# Patient Record
Sex: Male | Born: 1940 | Race: White | Hispanic: No | Marital: Married | Smoking: Current every day smoker
Health system: Southern US, Community
[De-identification: ages and names within clinical notes are randomized; demographics above are authoritative.]

## PROBLEM LIST (undated history)

## (undated) DIAGNOSIS — I7100 Dissection of unspecified site of aorta: Secondary | ICD-10-CM

## (undated) DIAGNOSIS — I716 Thoracoabdominal aortic aneurysm, without rupture: Principal | ICD-10-CM

## (undated) DIAGNOSIS — I10 Essential (primary) hypertension: Secondary | ICD-10-CM

## (undated) DIAGNOSIS — R634 Abnormal weight loss: Secondary | ICD-10-CM

## (undated) DIAGNOSIS — I701 Atherosclerosis of renal artery: Secondary | ICD-10-CM

## (undated) DIAGNOSIS — R0602 Shortness of breath: Secondary | ICD-10-CM

## (undated) DIAGNOSIS — N179 Acute kidney failure, unspecified: Secondary | ICD-10-CM

## (undated) DIAGNOSIS — Z86010 Personal history of colonic polyps: Secondary | ICD-10-CM

## (undated) DIAGNOSIS — N183 Chronic kidney disease, stage 3 (moderate): Secondary | ICD-10-CM

## (undated) HISTORY — DX: Personal history of colonic polyps: Z86.010

## (undated) HISTORY — DX: Shortness of breath: R06.02

## (undated) HISTORY — DX: Dissection of unspecified site of aorta: I71.00

## (undated) HISTORY — DX: Thoracoabdominal aortic aneurysm, without rupture: I71.6

## (undated) HISTORY — PX: TONSILLECTOMY: SUR1361

## (undated) HISTORY — DX: Acute kidney failure, unspecified: N17.9

## (undated) HISTORY — DX: Atherosclerosis of renal artery: I70.1

## (undated) HISTORY — PX: TOTAL KNEE ARTHROPLASTY: SHX125

## (undated) HISTORY — DX: Abnormal weight loss: R63.4

## (undated) HISTORY — PX: OTHER SURGICAL HISTORY: SHX169

## (undated) HISTORY — DX: Essential (primary) hypertension: I10

## (undated) HISTORY — DX: Chronic kidney disease, stage 3 (moderate): N18.3

---

## 2011-08-05 DIAGNOSIS — I701 Atherosclerosis of renal artery: Secondary | ICD-10-CM | POA: Insufficient documentation

## 2011-08-05 DIAGNOSIS — I7100 Dissection of unspecified site of aorta: Secondary | ICD-10-CM

## 2011-08-05 HISTORY — DX: Dissection of unspecified site of aorta: I71.00

## 2011-08-05 HISTORY — DX: Atherosclerosis of renal artery: I70.1

## 2012-01-15 DIAGNOSIS — R634 Abnormal weight loss: Secondary | ICD-10-CM

## 2012-01-15 HISTORY — DX: Abnormal weight loss: R63.4

## 2012-05-06 DIAGNOSIS — Z86010 Personal history of colonic polyps: Secondary | ICD-10-CM

## 2012-05-06 HISTORY — DX: Personal history of colonic polyps: Z86.010

## 2013-11-24 DIAGNOSIS — N179 Acute kidney failure, unspecified: Secondary | ICD-10-CM

## 2013-11-24 DIAGNOSIS — I716 Thoracoabdominal aortic aneurysm, without rupture, unspecified: Secondary | ICD-10-CM

## 2013-11-24 HISTORY — DX: Thoracoabdominal aortic aneurysm, without rupture, unspecified: I71.60

## 2013-11-24 HISTORY — DX: Acute kidney failure, unspecified: N17.9

## 2013-11-24 HISTORY — DX: Thoracoabdominal aortic aneurysm, without rupture: I71.6

## 2015-05-29 DIAGNOSIS — R0602 Shortness of breath: Secondary | ICD-10-CM

## 2015-05-29 HISTORY — DX: Shortness of breath: R06.02

## 2015-05-31 DIAGNOSIS — I10 Essential (primary) hypertension: Secondary | ICD-10-CM

## 2015-05-31 DIAGNOSIS — I129 Hypertensive chronic kidney disease with stage 1 through stage 4 chronic kidney disease, or unspecified chronic kidney disease: Secondary | ICD-10-CM | POA: Insufficient documentation

## 2015-05-31 HISTORY — DX: Essential (primary) hypertension: I10

## 2015-10-05 DIAGNOSIS — N183 Chronic kidney disease, stage 3 unspecified: Secondary | ICD-10-CM

## 2015-10-05 HISTORY — DX: Chronic kidney disease, stage 3 unspecified: N18.30

## 2016-10-07 ENCOUNTER — Ambulatory Visit (INDEPENDENT_AMBULATORY_CARE_PROVIDER_SITE_OTHER): Payer: Medicare Other | Admitting: Cardiology

## 2016-10-07 VITALS — BP 134/78 | HR 60 | Ht 67.0 in | Wt 142.1 lb

## 2016-10-07 DIAGNOSIS — J449 Chronic obstructive pulmonary disease, unspecified: Secondary | ICD-10-CM

## 2016-10-07 DIAGNOSIS — I129 Hypertensive chronic kidney disease with stage 1 through stage 4 chronic kidney disease, or unspecified chronic kidney disease: Secondary | ICD-10-CM | POA: Diagnosis not present

## 2016-10-07 DIAGNOSIS — I716 Thoracoabdominal aortic aneurysm, without rupture, unspecified: Secondary | ICD-10-CM

## 2016-10-07 DIAGNOSIS — N183 Chronic kidney disease, stage 3 unspecified: Secondary | ICD-10-CM

## 2016-10-07 NOTE — Patient Instructions (Signed)
Medication Instructions:  Your physician recommends that you continue on your current medications as directed. Please refer to the Current Medication list given to you today.   Labwork: Your physician recommends that you return for lab work in: today. BMP   Testing/Procedures: None  Follow-Up: Your physician wants you to follow-up in: 6 months. You will receive a reminder letter in the mail two months in advance. If you don't receive a letter, please call our office to schedule the follow-up appointment.   Any Other Special Instructions Will Be Listed Below (If Applicable).     If you need a refill on your cardiac medications before your next appointment, please call your pharmacy.

## 2016-10-07 NOTE — Progress Notes (Signed)
Cardiology Office Note:    Date:  10/07/2016   ID:  Jeff Montes, DOB [birth year], MRN [id number]  PCP:  Marylynn Pearson, MD  Cardiologist:  Norman Herrlich, MD    Referring MD: No ref. provider found    ASSESSMENT:    1. 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 Chronic obstructive pulmonary disease, unspecified COPD type (HCC)    PLAN:    In order of problems listed above:  1. Stable he is on optimal medical therapy with beta blocker heart rate and blood pressure at target and has declined elective surgical intervention 2. Stable blood pressure target continue beta blocker and recheck BMP for kidney function 3. Stable continue current therapy   Next appointment: 6 months   Medication Adjustments/Labs and Tests Ordered: Current medicines are reviewed at length with the patient today.  Concerns regarding medicines are outlined above.  No orders of the defined types were placed in this encounter.  No orders of the defined types were placed in this encounter.   Chief Complaint  Patient presents with  . Follow-up    6 month flup   . Hypertension  . Aneurysm    History of Present Illness:    Jeff Montes is a 76 y.o. 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 He complains of lower extremity weakness and limited ambulation remains active doing garden work and has had no angina dyspnea palpitation syncope or TIA.Marland Kitchen Compliance with diet, lifestyle and medications: Yes Past Medical History:  Diagnosis Date  . Abnormal weight loss 01/15/2012  . AKI (acute kidney injury) (HCC) 11/24/2013  . Aortic dissection (HCC) 08/05/2011  . CKD (chronic kidney disease) stage 3, GFR 30-59 ml/min  10/05/2015  . Essential hypertension 05/31/2015  . History of colon polyps 05/06/2012  . Renal artery stenosis (HCC) 08/05/2011  . SOB (shortness of breath) 05/29/2015  . 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 facial nerve tumor revmoval    . TONSILLECTOMY    . TOTAL KNEE ARTHROPLASTY      Current Medications: Current Meds  Medication Sig  . aspirin EC 81 MG tablet Take 81 mg by mouth daily.  . fluticasone (FLONASE) 50 MCG/ACT nasal spray Place 2 sprays into both nostrils daily.  Marland Kitchen labetalol (NORMODYNE) 300 MG tablet Take 300 mg by mouth 2 (two) times daily.  . tamsulosin (FLOMAX) 0.4 MG CAPS capsule Take 0.4 mg by mouth daily.  Marland Kitchen umeclidinium-vilanterol (ANORO ELLIPTA) 62.5-25 MCG/INH AEPB Inhale 1 puff into the lungs daily.  . vitamin B-12 (CYANOCOBALAMIN) 1000 MCG tablet Take 2,500 mcg by mouth daily.      Allergies:   Patient has no known allergies.   Social History   Social History  . Marital status: Married    Spouse name: N/A  . Number of children: N/A  . Years of education: N/A   Social History Main Topics  . Smoking status: Current Every Day Smoker    Packs/day: 0.50    Years: 63.00  . Smokeless tobacco: Never Used  . Alcohol use No  . Drug use: No  . Sexual activity: Not Asked   Other Topics Concern  .  None   Social History Narrative  . None     Family History: The patient's family history includes Aortic aneurysm in his brother; Aortic dissection in his brother; Heart disease in his mother. ROS:   Please see the history of present illness.    All other systems reviewed and are negative.  EKGs/Labs/Other Studies Reviewed:    The following studies were reviewed today  Recent Labs: No results found for requested labs within last 8760 hours.  Recent Lipid Panel No results found for: CHOL, TRIG, HDL, CHOLHDL, VLDL, LDLCALC, LDLDIRECT  Physical Exam:    VS:  BP 134/78  (BP Location: Right Arm, Patient Position: Sitting)   Pulse 60   Ht 5\' 7"  (1.702 m)   Wt 142 lb 1.9 oz (64.5 kg)   SpO2 97%   BMI 22.26 kg/m     Wt Readings from Last 3 Encounters:  10/07/16 142 lb 1.9 oz (64.5 kg)     GEN:  Well nourished, well developed in no acute distress HEENT: Normal NECK: No JVD; No carotid bruits LYMPHATICS: No lymphadenopathy CARDIAC: RRR, no murmurs, rubs, gallops RESPIRATORY:  Clear to auscultation without rales, wheezing or rhonchi  ABDOMEN: Soft, non-tender, non-distended MUSCULOSKELETAL:  No edema; No deformity  SKIN: Warm and dry NEUROLOGIC:  Alert and oriented x 3 PSYCHIATRIC:  Normal affect    Signed, Norman HerrlichBrian Kaysia Willard, MD  10/07/2016 4:24 PM    [facility]

## 2016-10-08 LAB — BASIC METABOLIC PANEL WITH GFR
BUN/Creatinine Ratio: 13 (ref 10–24)
BUN: 26 mg/dL (ref 8–27)
CO2: 22 mmol/L (ref 20–29)
Calcium: 9 mg/dL (ref 8.6–10.2)
Chloride: 104 mmol/L (ref 96–106)
Creatinine, Ser: 2.03 mg/dL — ABNORMAL HIGH (ref 0.76–1.27)
GFR calc Af Amer: 35 mL/min/1.73 — ABNORMAL LOW
GFR calc non Af Amer: 30 mL/min/1.73 — ABNORMAL LOW
Glucose: 100 mg/dL — ABNORMAL HIGH (ref 65–99)
Potassium: 4.2 mmol/L (ref 3.5–5.2)
Sodium: 139 mmol/L (ref 134–144)

## 2017-04-09 DIAGNOSIS — I10 Essential (primary) hypertension: Secondary | ICD-10-CM | POA: Diagnosis not present

## 2017-04-09 DIAGNOSIS — E7800 Pure hypercholesterolemia, unspecified: Secondary | ICD-10-CM | POA: Diagnosis not present

## 2017-04-09 DIAGNOSIS — I714 Abdominal aortic aneurysm, without rupture: Secondary | ICD-10-CM

## 2017-04-09 DIAGNOSIS — N189 Chronic kidney disease, unspecified: Secondary | ICD-10-CM

## 2017-04-09 DIAGNOSIS — R531 Weakness: Secondary | ICD-10-CM | POA: Diagnosis not present

## 2017-04-09 DIAGNOSIS — Z96651 Presence of right artificial knee joint: Secondary | ICD-10-CM

## 2017-04-10 ENCOUNTER — Other Ambulatory Visit: Payer: Self-pay

## 2017-04-10 ENCOUNTER — Observation Stay (HOSPITAL_COMMUNITY)
Admission: AD | Admit: 2017-04-10 | Discharge: 2017-04-11 | Disposition: A | Discharge status: Home / Self Care | Payer: Medicare Other | Source: Other Acute Inpatient Hospital | Attending: Internal Medicine | Admitting: Internal Medicine

## 2017-04-10 ENCOUNTER — Encounter (HOSPITAL_COMMUNITY): Payer: Self-pay | Admitting: *Deleted

## 2017-04-10 DIAGNOSIS — I63311 Cerebral infarction due to thrombosis of right middle cerebral artery: Secondary | ICD-10-CM

## 2017-04-10 DIAGNOSIS — N183 Chronic kidney disease, stage 3 unspecified: Secondary | ICD-10-CM | POA: Diagnosis present

## 2017-04-10 DIAGNOSIS — R29810 Facial weakness: Secondary | ICD-10-CM | POA: Insufficient documentation

## 2017-04-10 DIAGNOSIS — E785 Hyperlipidemia, unspecified: Secondary | ICD-10-CM | POA: Diagnosis not present

## 2017-04-10 DIAGNOSIS — I129 Hypertensive chronic kidney disease with stage 1 through stage 4 chronic kidney disease, or unspecified chronic kidney disease: Secondary | ICD-10-CM | POA: Insufficient documentation

## 2017-04-10 DIAGNOSIS — Z96659 Presence of unspecified artificial knee joint: Secondary | ICD-10-CM | POA: Diagnosis not present

## 2017-04-10 DIAGNOSIS — Z96651 Presence of right artificial knee joint: Secondary | ICD-10-CM | POA: Diagnosis not present

## 2017-04-10 DIAGNOSIS — I6521 Occlusion and stenosis of right carotid artery: Secondary | ICD-10-CM

## 2017-04-10 DIAGNOSIS — E7800 Pure hypercholesterolemia, unspecified: Secondary | ICD-10-CM | POA: Diagnosis not present

## 2017-04-10 DIAGNOSIS — J449 Chronic obstructive pulmonary disease, unspecified: Secondary | ICD-10-CM | POA: Diagnosis not present

## 2017-04-10 DIAGNOSIS — Z8249 Family history of ischemic heart disease and other diseases of the circulatory system: Secondary | ICD-10-CM | POA: Diagnosis not present

## 2017-04-10 DIAGNOSIS — Z86010 Personal history of colonic polyps: Secondary | ICD-10-CM | POA: Insufficient documentation

## 2017-04-10 DIAGNOSIS — I2510 Atherosclerotic heart disease of native coronary artery without angina pectoris: Secondary | ICD-10-CM

## 2017-04-10 DIAGNOSIS — Z79899 Other long term (current) drug therapy: Secondary | ICD-10-CM | POA: Diagnosis not present

## 2017-04-10 DIAGNOSIS — Z7902 Long term (current) use of antithrombotics/antiplatelets: Secondary | ICD-10-CM | POA: Diagnosis not present

## 2017-04-10 DIAGNOSIS — I701 Atherosclerosis of renal artery: Secondary | ICD-10-CM | POA: Diagnosis present

## 2017-04-10 DIAGNOSIS — I714 Abdominal aortic aneurysm, without rupture: Secondary | ICD-10-CM | POA: Diagnosis not present

## 2017-04-10 DIAGNOSIS — Z720 Tobacco use: Secondary | ICD-10-CM | POA: Diagnosis not present

## 2017-04-10 DIAGNOSIS — I10 Essential (primary) hypertension: Secondary | ICD-10-CM | POA: Diagnosis not present

## 2017-04-10 DIAGNOSIS — I6523 Occlusion and stenosis of bilateral carotid arteries: Secondary | ICD-10-CM | POA: Insufficient documentation

## 2017-04-10 DIAGNOSIS — F17210 Nicotine dependence, cigarettes, uncomplicated: Secondary | ICD-10-CM | POA: Insufficient documentation

## 2017-04-10 DIAGNOSIS — I214 Non-ST elevation (NSTEMI) myocardial infarction: Principal | ICD-10-CM

## 2017-04-10 DIAGNOSIS — Z7982 Long term (current) use of aspirin: Secondary | ICD-10-CM | POA: Insufficient documentation

## 2017-04-10 DIAGNOSIS — N189 Chronic kidney disease, unspecified: Secondary | ICD-10-CM | POA: Diagnosis not present

## 2017-04-10 DIAGNOSIS — I716 Thoracoabdominal aortic aneurysm, without rupture: Secondary | ICD-10-CM | POA: Diagnosis not present

## 2017-04-10 DIAGNOSIS — I639 Cerebral infarction, unspecified: Secondary | ICD-10-CM

## 2017-04-10 DIAGNOSIS — R531 Weakness: Secondary | ICD-10-CM | POA: Diagnosis not present

## 2017-04-10 DIAGNOSIS — Z888 Allergy status to other drugs, medicaments and biological substances status: Secondary | ICD-10-CM | POA: Diagnosis not present

## 2017-04-10 DIAGNOSIS — R778 Other specified abnormalities of plasma proteins: Secondary | ICD-10-CM | POA: Diagnosis present

## 2017-04-10 DIAGNOSIS — R748 Abnormal levels of other serum enzymes: Secondary | ICD-10-CM | POA: Insufficient documentation

## 2017-04-10 DIAGNOSIS — R7989 Other specified abnormal findings of blood chemistry: Secondary | ICD-10-CM | POA: Diagnosis present

## 2017-04-10 LAB — CBC WITH DIFFERENTIAL/PLATELET
BASOS PCT: 0 %
Basophils Absolute: 0 10*3/uL (ref 0.0–0.1)
EOS ABS: 0.1 10*3/uL (ref 0.0–0.7)
Eosinophils Relative: 1 %
HCT: 44 % (ref 39.0–52.0)
HEMOGLOBIN: 14 g/dL (ref 13.0–17.0)
Lymphocytes Relative: 16 %
Lymphs Abs: 1.6 10*3/uL (ref 0.7–4.0)
MCH: 28.6 pg (ref 26.0–34.0)
MCHC: 31.8 g/dL (ref 30.0–36.0)
MCV: 90 fL (ref 78.0–100.0)
Monocytes Absolute: 0.5 10*3/uL (ref 0.1–1.0)
Monocytes Relative: 5 %
Neutro Abs: 7.5 10*3/uL (ref 1.7–7.7)
Neutrophils Relative %: 78 %
PLATELETS: 214 10*3/uL (ref 150–400)
RBC: 4.89 MIL/uL (ref 4.22–5.81)
RDW: 15.3 % (ref 11.5–15.5)
WBC: 9.7 10*3/uL (ref 4.0–10.5)

## 2017-04-10 LAB — MRSA PCR SCREENING: MRSA BY PCR: NEGATIVE

## 2017-04-10 LAB — PROTIME-INR
INR: 1
PROTHROMBIN TIME: 13.1 s (ref 11.4–15.2)

## 2017-04-10 LAB — TROPONIN I: Troponin I: 4.42 ng/mL (ref <0.03)

## 2017-04-10 LAB — BASIC METABOLIC PANEL
Anion gap: 12 (ref 5–15)
BUN: 24 mg/dL — ABNORMAL HIGH (ref 6–20)
CALCIUM: 8.7 mg/dL — AB (ref 8.9–10.3)
CO2: 22 mmol/L (ref 22–32)
CREATININE: 1.83 mg/dL — AB (ref 0.61–1.24)
Chloride: 104 mmol/L (ref 101–111)
GFR calc Af Amer: 39 mL/min — ABNORMAL LOW (ref >60)
GFR, EST NON AFRICAN AMERICAN: 34 mL/min — AB (ref >60)
Glucose, Bld: 99 mg/dL (ref 65–99)
Potassium: 4.5 mmol/L (ref 3.5–5.1)
SODIUM: 138 mmol/L (ref 135–145)

## 2017-04-10 LAB — APTT: aPTT: 32 seconds (ref 24–36)

## 2017-04-10 MED ORDER — ENSURE ENLIVE PO LIQD
237.0000 mL | Freq: Two times a day (BID) | ORAL | Status: DC
  Administered 2017-04-11: 237 mL via ORAL

## 2017-04-10 MED ORDER — LABETALOL HCL 200 MG PO TABS
300.0000 mg | ORAL_TABLET | Freq: Two times a day (BID) | ORAL | Status: DC
  Administered 2017-04-10: 22:00:00 300 mg via ORAL
  Filled 2017-04-10 (×2): qty 1

## 2017-04-10 MED ORDER — UMECLIDINIUM-VILANTEROL 62.5-25 MCG/INH IN AEPB
1.0000 | INHALATION_SPRAY | Freq: Every day | RESPIRATORY_TRACT | Status: DC
  Administered 2017-04-11: 10:00:00 1 via RESPIRATORY_TRACT
  Filled 2017-04-10: qty 14

## 2017-04-10 MED ORDER — TAMSULOSIN HCL 0.4 MG PO CAPS
0.4000 mg | ORAL_CAPSULE | Freq: Every day | ORAL | Status: DC
  Administered 2017-04-10 – 2017-04-11 (×2): 0.4 mg via ORAL
  Filled 2017-04-10 (×2): qty 1

## 2017-04-10 MED ORDER — HYDRALAZINE HCL 20 MG/ML IJ SOLN: 10.0000 mg | Freq: Three times a day (TID) | INTRAMUSCULAR | Status: DC | PRN

## 2017-04-10 MED ORDER — POLYETHYLENE GLYCOL 3350 17 G PO PACK: 17.0000 g | PACK | Freq: Every day | ORAL | Status: DC | PRN

## 2017-04-10 MED ORDER — STROKE: EARLY STAGES OF RECOVERY BOOK
Freq: Once | Status: AC
  Administered 2017-04-10: 1
  Filled 2017-04-10: qty 1

## 2017-04-10 MED ORDER — ONDANSETRON HCL 4 MG/2ML IJ SOLN: 4.0000 mg | Freq: Four times a day (QID) | INTRAMUSCULAR | Status: DC | PRN

## 2017-04-10 MED ORDER — HYDROCODONE-ACETAMINOPHEN 5-325 MG PO TABS: 1.0000 | ORAL_TABLET | ORAL | Status: DC | PRN

## 2017-04-10 MED ORDER — UMECLIDINIUM-VILANTEROL 62.5-25 MCG/INH IN AEPB
1.0000 | INHALATION_SPRAY | Freq: Every day | RESPIRATORY_TRACT | Status: DC
  Filled 2017-04-10: qty 14

## 2017-04-10 MED ORDER — ACETAMINOPHEN 325 MG PO TABS: 650.0000 mg | ORAL_TABLET | Freq: Four times a day (QID) | ORAL | Status: DC | PRN

## 2017-04-10 MED ORDER — ENOXAPARIN SODIUM 30 MG/0.3ML ~~LOC~~ SOLN
30.0000 mg | SUBCUTANEOUS | Status: DC
  Administered 2017-04-10: 30 mg via SUBCUTANEOUS

## 2017-04-10 MED ORDER — FLUTICASONE PROPIONATE 50 MCG/ACT NA SUSP
2.0000 | Freq: Every day | NASAL | Status: DC
  Administered 2017-04-11: 2 via NASAL
  Filled 2017-04-10: qty 16

## 2017-04-10 MED ORDER — ASPIRIN EC 325 MG PO TBEC: 325.0000 mg | DELAYED_RELEASE_TABLET | Freq: Every day | ORAL | Status: DC

## 2017-04-10 MED ORDER — ONDANSETRON HCL 4 MG PO TABS
4.0000 mg | ORAL_TABLET | Freq: Four times a day (QID) | ORAL | Status: DC | PRN
Start: 2017-04-10 — End: 2017-04-11

## 2017-04-10 MED ORDER — ASPIRIN EC 81 MG PO TBEC: 81.0000 mg | DELAYED_RELEASE_TABLET | Freq: Every day | ORAL | Status: DC

## 2017-04-10 MED ORDER — ENOXAPARIN SODIUM 40 MG/0.4ML ~~LOC~~ SOLN
40.0000 mg | SUBCUTANEOUS | Status: DC
  Filled 2017-04-10: qty 0.4

## 2017-04-10 MED ORDER — ACETAMINOPHEN 650 MG RE SUPP
650.0000 mg | Freq: Four times a day (QID) | RECTAL | Status: DC | PRN
Start: 2017-04-10 — End: 2017-04-11

## 2017-04-10 NOTE — Consult Note (Signed)
Referring Physician: Dr. Ophelia Charter    Chief Complaint: Transient right sided numbness  HPI: Jeff Montes is an 77 y.o. 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 He had no associated headache, vision change, speech deficit, facial droop or weakness. He started to feel unwell on Sunday, with malaise and mild nausea. When he was seen for this at his PCP's office, stroke symptoms were noted and he was sent to the [facility] ED for stroke evaluation. MRI showed 2 small acute/subacute ischemic infarctions in the right posterior parietal and occipital lobes.    This morning his troponin bumped up to 10. His EKG did not demonstrate any acute changes but echocardiogram was concerning for WMA.and patient was transferred to [facility] for further treatment.  PMHx includes CKD stage III, thoracoabdominal aortic aneurysm, aortic dissection, renal artery stenosis, HTN and COPD. He denies any prior history of stroke.   Home medications include ASA to prevent thrombosis of his thoracoabdominal aortic aneurysm, per daughter. He is not on a blood thinner or a statin.   Past Medical History:  Diagnosis Date  . Abnormal weight loss 01/15/2012  . AKI (acute kidney injury) (HCC) 11/24/2013  . Aortic dissection (HCC) 08/05/2011  . CKD (chronic kidney disease) stage 3, GFR 30-59 ml/min (HCC) 10/05/2015  . Essential hypertension 05/31/2015  . History of colon polyps 05/06/2012  . Renal artery stenosis (HCC) 08/05/2011  . SOB (shortness of breath) 05/29/2015  . 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 facial nerve tumor revmoval    . TONSILLECTOMY    . TOTAL KNEE ARTHROPLASTY      Family History  Problem Relation Age of Onset  . Heart disease Mother   . Aortic  aneurysm Brother   . Aortic dissection Brother    Social History:  reports that he has been smoking cigarettes.  He has a 31.50 pack-year smoking history. he has never used smokeless tobacco. He reports that he does not drink alcohol or use drugs.  Allergies: No Known Allergies  Home Medications:    ROS: No SOB or CP. Other ROS as per HPI.   Physical Examination: Blood pressure (!) 167/107, pulse 66, temperature 98.4 F (36.9 C), temperature source Oral, resp. rate 19, height 5\' 8"  (1.727 m), weight 63.1 kg (139 lb 1.8 oz), SpO2 96 %.  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  Ext: Warm and well perfused  Neurologic Examination: Mental Status: Alert, fully oriented, thought content appropriate.  Speech fluent without evidence of aphasia.  Able to follow all commands without difficulty. Cranial Nerves: II:  Visual fields intact with no extinction to DSS. Pupils equal.  III,IV, VI: EOMI without nystagmus.  V,VII: Left chronic facial droop from prior surgery. Movement is normal on the right. Temp sensation to V1 and V3 is equal bilaterally.  VIII: HOH  IX,X: No hypophonia XI: Head at midline XII: Tongue deviates slightly to left Motor: Right : Upper extremity   5/5    Left:     Upper extremity   5/5  Lower extremity   5/5     Lower extremity   5/5 No atrophy noted Sensory: Decreased temp sensation to RUE and LLE. FT intact without extinction to DSS.  Deep Tendon Reflexes:  Normoactive x 4 except for absent patellar on right due to prior knee surgery Plantars: Equivocal bilaterally Cerebellar: No ataxia with FNF bilaterally Gait: Deferred  No results found for this or any previous visit (from the past 48 hour(s)). No results found.  Assessment: 77 y.o. 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  1. DDx for mechanism of stroke includes cardioembolic, artery-to-artery embolization  and in situ thrombosis.  2. 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 Elevated troponin with abnormal echocardiogram. Felt to be NSTEMI versus demand ischemia per Cardiology. Of note, elevated troponin could also be due to stroke.  4. History of AAA 5. Stroke Risk Factors - HTN  Recommendations: 1. MRA head 2. Carotid ultrasound 3. PT consult, OT consult, Speech consult 4. Add Plavix to ASA as he is classifiable as having failed ASA monotherapy.  5. Start atorvastatin 40 mg po qd. Obtain baseline CK level and LFTs 6. BP management as per standard protocol. Out of permissive HTN time window.  7. Telemetry monitoring 8. Frequent neuro checks 9. HgbA1c, fasting lipid panel  @Electronically  signed: Dr. Caryl PinaEric Cagney Degrace@ 04/10/2017, 5:52 PM

## 2017-04-10 NOTE — Plan of Care (Signed)
Continue current care plan 

## 2017-04-10 NOTE — Consult Note (Signed)
Cardiology Consult    Patient ID: Jeff Montes MRN: [id number], DOB/AGE: [birth year]   Admit date: 04/10/2017 Date of Consult: 04/10/2017  Primary Physician: Marylynn Pearson, MD Primary Cardiologist: Dr. Dulce Sellar Requesting Provider: Dr. Ophelia Charter Reason for Consultation: Elevated Trop, abnormal Echo  Jeff Montes is a 77 y.o. male who is being seen today for the evaluation of elevated troponin/abnormal echo at the request of Dr. Ophelia Charter.   Patient Profile    77 year old male with past medical history of brain surgery 2/2 brain CA, hypertension, hypercholesteremia, CKD, AAA who presented to [facility] with numbness and weakness on his right side.  Found to have elevated troponin, and abnormal echo.  Past Medical History   Past Medical History:  Diagnosis Date  . Abnormal weight loss 01/15/2012  . AKI (acute kidney injury) (HCC) 11/24/2013  . Aortic dissection (HCC) 08/05/2011  . CKD (chronic kidney disease) stage 3, GFR 30-59 ml/min (HCC) 10/05/2015  . Essential hypertension 05/31/2015  . History of colon polyps 05/06/2012  . Renal artery stenosis (HCC) 08/05/2011  . SOB (shortness of breath) 05/29/2015  . 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 facial nerve tumor revmoval    . TONSILLECTOMY    . TOTAL KNEE ARTHROPLASTY       Allergies  No Known Allergies  History of Present Illness    [name] is a 77 year old male with past medical history of brain surgery 2/2 brain CA, hypertension, hypercholesteremia, CKD, AAA.  He has been followed by Dr. Dulce Sellar as an outpatient.  Has undergone nuclear stress testing, but never had a cardiac catheterization.  He presented to [facility] on 2/14 with onset of right-sided weakness and numbness when getting in the shower earlier that morning.  States he was getting ready to visit his PCP for routine visit, and developed symptoms.  Went to PCP office and was  referred to the emergency department for concern for CVA.  In the ED his CT head was negative, neurology was consulted with recommendations for MRI, carotid Dopplers and echo.  While in the emergency department his symptoms resolved with the exception of intermittent numbness in the right elbow and wrist.  Labs there showed creatinine 1.7, stable electrolytes, hemoglobin 13.9, troponin I 0.16>> peaking at 10.  MRI showed tiny foci of restricted diffusion in the right posterior temporal and occipital regions.  Echocardiogram showed normal LV function with mild mid septal wall hypokinesis along with mild to moderate mitral regurg.  Given these findings cardiology was consulted and recommendation was given to transfer to [facility] for further workup with possible cardiac catheterization.  On interview patient denies any chest pain, dyspnea, palpitations, or lower extremity edema prior to admission.  Reports he currently lives with his wife and is fairly active around his home.  He has not had any anginal symptoms prior to admission.  Of note he does have a history of CKD stage III, creatinine 1.7 during admission at [facility].  He states his renal function has been followed closely over the past several years.  He is concerned about any testing that would require contrast dye and the risk of being placed on dialysis.  He is noted to have an abdominal aortic aneurysm measuring 6.1 cm that is followed as an outpatient, and has been seen by vascular surgery with options discussed to correct this.  He is not interested in pursuing any surgical option that would require contrast dye.  He  is currently a DNR which was confirmed at the bedside.  Inpatient Medications    .  stroke: mapping our early stages of recovery book   Does not apply Once  . aspirin EC  81 mg Oral Daily  . enoxaparin (LOVENOX) injection  40 mg Subcutaneous Q24H  . [START ON 04/11/2017] feeding supplement (ENSURE ENLIVE)  237 mL Oral BID BM  .  fluticasone  2 spray Each Nare Daily  . labetalol  300 mg Oral BID  . tamsulosin  0.4 mg Oral Daily  . umeclidinium-vilanterol  1 puff Inhalation Daily    Family History    Family History  Problem Relation Age of Onset  . Heart disease Mother   . Aortic aneurysm Brother   . Aortic dissection Brother     Social History    Social History   Socioeconomic History  . Marital status: Married    Spouse name: Not on file  . Number of children: Not on file  . Years of education: Not on file  . Highest education level: Not on file  Social Needs  . Financial resource strain: Not on file  . Food insecurity - worry: Not on file  . Food insecurity - inability: Not on file  . Transportation needs - medical: Not on file  . Transportation needs - non-medical: Not on file  Occupational History  . Not on file  Tobacco Use  . Smoking status: Current Every Day Smoker    Packs/day: 0.50    Years: 63.00    Pack years: 31.50    Types: Cigarettes  . Smokeless tobacco: Never Used  Substance and Sexual Activity  . Alcohol use: No  . Drug use: No  . Sexual activity: Not on file  Other Topics Concern  . Not on file  Social History Narrative  . Not on file     Review of Systems    See HPI All other systems reviewed and are otherwise negative except as noted above.  Physical Exam    Blood pressure (!) 175/111, pulse 66, temperature 98.4 F (36.9 C), temperature source Oral, resp. rate 15, height 5\' 8"  (1.727 m), weight 139 lb 1.8 oz (63.1 kg), SpO2 97 %.  General: Pleasant, older white male, NAD Psych: Normal affect. Neuro: Alert and oriented X 3. Moves all extremities spontaneously.  Left-sided facial droop HEENT: Normal  Neck: Supple, + bruits, no JVD. Lungs:  Resp regular and unlabored, CTA. Heart: RRR no s3, s4, soft systolic murmur. Abdomen: Soft, non-tender, non-distended, BS + x 4.  Extremities: No clubbing, cyanosis or edema. DP/PT/Radials 2+ and equal bilaterally.  Labs     Troponin (Point of Care Test) No results for input(s): TROPIPOC in the last 72 hours. No results for input(s): CKTOTAL, CKMB, TROPONINI in the last 72 hours. No results found for: WBC, HGB, HCT, MCV, PLT No results for input(s): NA, K, CL, CO2, BUN, CREATININE, CALCIUM, PROT, BILITOT, ALKPHOS, ALT, AST, GLUCOSE in the last 168 hours.  Invalid input(s): LABALBU No results found for: CHOL, HDL, LDLCALC, TRIG No results found for: [facility]   Radiology Studies    No results found.  ECG & Cardiac Imaging    EKG:  The EKG was personally reviewed and demonstrates sinus rhythm without acute ischemic changes.  Echo: Normal EF with mild septal wall motion abnormality (noted in chart from [facility]).   Assessment & Plan    77 year old male with past medical history of brain surgery 2/2 brain CA, hypertension, hypercholesteremia,  CKD, AAA who presented to [facility] with numbness and weakness on his right side.  Found to have elevated troponin, and abnormal echo.    1.  Acute CVA: CT head was unremarkable, but MRI completed. seen by neuro-tele   while at [facility]. Further workup per admitting, and neurology.    2. NSTEMI versus demand ischemia:  Troponin peaked at 10.  EKG nonacute with no ischemic changes.  Echo reports normal EF with mild septal wall motion abnormality.  He had no chest pain, or other anginal symptoms prior to admission.  Reportedly has undergone stress testing in the past, but has never had a cardiac catheterization.  Option of catheterization was discussed with the patient and he is adamant about not undergoing any ischemic testing that requires IV contrast dye given his history of CKD.  MD to follow with discussion.  Suspect we will need to treat medically to respect his wishes.  3.  Hypertension: He is currently hypertensive on admission.  Appears that he takes labetalol 300 mg twice daily.  Could consider changing to metoprolol/Coreg given concern for  CAD.  4.  Hypercholesteremia: On statin  5.  AAA: This is followed as an outpatient with most recent report per family at 6.1 cm.  Has been seen by Dr. Sondra Comeruz and options discussed, but patient does not want to proceed with surgical intervention.  Janice CoffinSigned, Lindsay Roberts, NP-C Pager [phone] 04/10/2017, 6:18 PM As above, patient seen and examined.  Briefly he is a 77 year old male with past medical history of hypertension, COPD, thoracic and abdominal aortic aneurysms, type B aortic dissection, chronic stage III kidney disease and recent CVA transferred [facility] for elevated troponin.  [facility] with complaints of "tingling" on right side.  No dysarthria or loss of strength/sensation.  MRI showed CVA.  Troponins were checked and increased to 10.  Echocardiogram showed preserved LV function, mild septal hypokinesis and mild to moderate mitral regurgitation.  Electrocardiogram shows sinus rhythm with no significant ST changes. 1 elevated troponin-I discussed this finding in depth with patient and his daughter.  This may be related to his CVA but could also be non-ST elevation myocardial infarction.  I discussed the risks and benefits of cardiac catheterization including CVA, death, myocardial infarction and worsening renal insufficiency.  The patient stated he will never to consider any procedure that requires intravenous dye.  I explained the risk of undiagnosed coronary disease including myocardial infarction and death and he is adamant about not proceeding with catheterization.  It would therefore also not be helpful to pursue functional study as he would not be agreeable to cath if study abnormal.  Would treat medically.  Continue aspirin, beta-blocker and would add statin.  2 history of thoracic and abdominal aortic aneurysm/history of type B aortic dissection-patient apparently has a 6.1 abdominal aortic aneurysm and has refused repair in the past.  Continue blood  pressure control.  3 hypertension-blood pressure is elevated.  Would continue labetalol and follow.  Would add amlodipine if blood pressure continues to be increased.  4 tobacco abuse-patient counseled on discontinuing.  5 stage III chronic kidney disease-patient refuses all procedures requiring dye.  6 No CODE BLUE  7 recent CVA-management per primary service.  Cardiology will sign off.  Please call with questions.  Olga MillersBrian Cayleen Benjamin, MD

## 2017-04-10 NOTE — H&P (Signed)
History and Physical    Jeff Montes ZOX:[id number] DOB: [birth year] DOA: 04/10/2017  PCP: Marylynn Pearson, MD Consultants:  Cardiology, Neurology Patient coming from: Home - lives with his wife; NOK: daughter   Chief Complaint: left sided tingling form neck to toes  HPI: Jeff Montes is a 77 y.o. 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 Patient reported that since [place] he was not feeling good, complaining of malaise and mild nausea. He developed left sided tingling, numbness upon waking up yesterday and was seen at his PCP office from where he was referred to the [facility] for CVA/TIA evaluation. MRI showed 2 small acute/subacute infarcts in the right posterior parietal and occipital region. His daughter reported that patient  Did not have any focal weakness or slurred speech more than at baseline.  This morning his troponin bumped up to 10, EKG did not demonstrate any acute changes. Echo was concerning for WMA.and patient was transferred to [facility] for further treatment. He denied chest pain, SOB, PND, jaw claudication Cardiology and Neurology services were notified about patient's admission and will see him today   Review of Systems: As per HPI; otherwise review of systems reviewed and negative.   Ambulatory Status: Ambulates with cane  Past Medical History:  Diagnosis Date  . Abnormal weight loss 01/15/2012  . AKI (acute kidney injury) (HCC) 11/24/2013  . Aortic dissection (HCC) 08/05/2011  . CKD (chronic kidney disease) stage 3, GFR 30-59 ml/min (HCC) 10/05/2015  . Essential hypertension 05/31/2015  . History of colon polyps 05/06/2012  . Renal artery stenosis (HCC) 08/05/2011  . SOB (shortness of breath) 05/29/2015  . 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 facial nerve tumor revmoval    . TONSILLECTOMY    . TOTAL KNEE ARTHROPLASTY      Social History   Socioeconomic History  . Marital status: Married    Spouse name: Not on file  . Number of children: Not on file  . Years of education: Not on file  . Highest education level: Not on file  Social Needs  . Financial resource strain: Not on file  . Food insecurity - worry: Not on file  . Food insecurity - inability: Not on file  . Transportation needs - medical: Not on file  . Transportation needs - non-medical: Not on file  Occupational History  . Not on file  Tobacco Use  . Smoking status: Current Every Day Smoker    Packs/day: 0.50    Years: 63.00    Pack years: 31.50    Types: Cigarettes  . Smokeless tobacco: Never Used  Substance and Sexual Activity  . Alcohol use: No  . Drug use: No  . Sexual activity: Not on file  Other Topics Concern  . Not on file  Social History Narrative  . Not on file    Allergies  Allergen Reactions  . Acyclovir And Related     Family History  Problem Relation Age of Onset  . Heart disease Mother   . Aortic aneurysm Brother   . Aortic dissection Brother     Prior to Admission medications   Medication Sig Start Date End Date Taking? Authorizing Provider  aspirin EC 81 MG tablet Take 81 mg by mouth daily.    [provider]  fluticasone (FLONASE) 50 MCG/ACT  nasal spray Place 2 sprays into both nostrils daily. 09/26/16   [provider]  labetalol (NORMODYNE) 300 MG tablet Take 300 mg by mouth 2 (two) times daily. 08/23/16   [provider]  tamsulosin (FLOMAX) 0.4 MG CAPS capsule Take 0.4 mg by mouth daily. 03/30/16   [provider]  umeclidinium-vilanterol (ANORO ELLIPTA) 62.5-25 MCG/INH AEPB Inhale 1 puff into the lungs daily.    [provider]  vitamin B-12 (CYANOCOBALAMIN) 1000 MCG tablet Take 2,500 mcg by mouth daily.     [provider]    Physical  Exam: Vitals:   04/10/17 1644 04/10/17 1710  BP: (!) 167/107 (!) 175/111  Pulse: 66 66  Resp: 19 15  Temp: 98.4 F (36.9 C)   TempSrc: Oral   SpO2: 96% 97%  Weight: 63.1 kg (139 lb 1.8 oz)   Height: 5\' 8"  (1.727 m)    General:  Appears calm and comfortable Eyes: PERRLA, sckerae unicteric, EOM intact,  ENT: normal external ear canals, oral mucous membranes moist and intact, no nasal discharge Neck: no lympnadenopathy, masses or thyromegaly Cardiovascular: RRR, no murmurs, gallops or rubs, no LE edema. Respiratory: CTA bilaterally, no adventitious sounds auscultated. Normal respiratory effort. Abdomen: soft, NT / ND, BS (+) 4, no masses or organomegaly appreciated Skin: no rash or induration seen on limited exam Musculoskeletal: no joint deformities observed, active full ROM  Psychiatric: grossly normal mood and affect, speech fluent and appropriate Neurologic: CN II-XII grossly normal, patient has left sided facial droop and slightly dysarthric, but these findings are not new; moves all extremities independently, DTR and sensation intact.      Radiological Exams on Admission: No results found.  EKG: pending Tracing from outside facility revealed SR, possible LVH with ST depression  Labs on Admission: labs are oending  Assessment/Plan Principal Problem:   NSTEMI (non-ST elevated myocardial infarction) (HCC) Active Problems:   CKD (chronic kidney disease) stage 3, GFR 30-59 ml/min (HCC)   Renal artery stenosis (HCC)   HTN (hypertension)   CVA (cerebral vascular accident) (HCC)   Elevated troponin I level    NSTEMI - patient admitted to SDU, continue cycle troponin, monitor on telemetry Cardiology is on board and will follow their recommendations Preferred choice of anticoagulation thyerapy is heparin, but given his history of chronic micro hemorrhages this might be challenging and ultimately needs to be approved by neurology  CVA - patient has h/o right ICA stenosis that  could be a culprit  Admitted on stroke protocol Will follow Neurology recommendations  HTN - BP is elevated, but will not treat at this oint given permissive HTN   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 [email or web address] Password [email or web address]  04/10/2017, 6:45 PM

## 2017-04-11 ENCOUNTER — Inpatient Hospital Stay (HOSPITAL_COMMUNITY): Payer: Medicare Other

## 2017-04-11 ENCOUNTER — Encounter (HOSPITAL_COMMUNITY)
Admission: AD | Disposition: A | Discharge status: Home / Self Care | Payer: Self-pay | Source: Other Acute Inpatient Hospital | Attending: Internal Medicine

## 2017-04-11 DIAGNOSIS — R748 Abnormal levels of other serum enzymes: Secondary | ICD-10-CM

## 2017-04-11 DIAGNOSIS — I63311 Cerebral infarction due to thrombosis of right middle cerebral artery: Secondary | ICD-10-CM | POA: Diagnosis not present

## 2017-04-11 DIAGNOSIS — I214 Non-ST elevation (NSTEMI) myocardial infarction: Secondary | ICD-10-CM | POA: Diagnosis not present

## 2017-04-11 DIAGNOSIS — R778 Other specified abnormalities of plasma proteins: Secondary | ICD-10-CM | POA: Diagnosis present

## 2017-04-11 DIAGNOSIS — I701 Atherosclerosis of renal artery: Secondary | ICD-10-CM

## 2017-04-11 DIAGNOSIS — N183 Chronic kidney disease, stage 3 (moderate): Secondary | ICD-10-CM | POA: Diagnosis not present

## 2017-04-11 DIAGNOSIS — I10 Essential (primary) hypertension: Secondary | ICD-10-CM

## 2017-04-11 DIAGNOSIS — R7989 Other specified abnormal findings of blood chemistry: Secondary | ICD-10-CM

## 2017-04-11 LAB — COMPREHENSIVE METABOLIC PANEL
ALT: 19 U/L (ref 17–63)
AST: 25 U/L (ref 15–41)
Albumin: 3.1 g/dL — ABNORMAL LOW (ref 3.5–5.0)
Alkaline Phosphatase: 98 U/L (ref 38–126)
Anion gap: 10 (ref 5–15)
BILIRUBIN TOTAL: 0.8 mg/dL (ref 0.3–1.2)
BUN: 25 mg/dL — ABNORMAL HIGH (ref 6–20)
CHLORIDE: 104 mmol/L (ref 101–111)
CO2: 26 mmol/L (ref 22–32)
Calcium: 8.8 mg/dL — ABNORMAL LOW (ref 8.9–10.3)
Creatinine, Ser: 1.95 mg/dL — ABNORMAL HIGH (ref 0.61–1.24)
GFR, EST AFRICAN AMERICAN: 36 mL/min — AB (ref >60)
GFR, EST NON AFRICAN AMERICAN: 31 mL/min — AB (ref >60)
Glucose, Bld: 139 mg/dL — ABNORMAL HIGH (ref 65–99)
POTASSIUM: 4.4 mmol/L (ref 3.5–5.1)
Sodium: 140 mmol/L (ref 135–145)
Total Protein: 5.7 g/dL — ABNORMAL LOW (ref 6.5–8.1)

## 2017-04-11 LAB — LIPID PANEL
CHOL/HDL RATIO: 3.6 ratio
CHOLESTEROL: 149 mg/dL (ref 0–200)
HDL: 41 mg/dL (ref >40)
LDL CALC: 91 mg/dL (ref 0–99)
TRIGLYCERIDES: 86 mg/dL (ref <150)
VLDL: 17 mg/dL (ref 0–40)

## 2017-04-11 LAB — CBC
HEMATOCRIT: 44 % (ref 39.0–52.0)
Hemoglobin: 14 g/dL (ref 13.0–17.0)
MCH: 28.6 pg (ref 26.0–34.0)
MCHC: 31.8 g/dL (ref 30.0–36.0)
MCV: 89.8 fL (ref 78.0–100.0)
PLATELETS: 218 10*3/uL (ref 150–400)
RBC: 4.9 MIL/uL (ref 4.22–5.81)
RDW: 15 % (ref 11.5–15.5)
WBC: 8.8 10*3/uL (ref 4.0–10.5)

## 2017-04-11 LAB — HEMOGLOBIN A1C
Hgb A1c MFr Bld: 5.6 % (ref 4.8–5.6)
Mean Plasma Glucose: 114.02 mg/dL

## 2017-04-11 LAB — TROPONIN I: Troponin I: 3.64 ng/mL (ref <0.03)

## 2017-04-11 SURGERY — LEFT HEART CATH AND CORONARY ANGIOGRAPHY
Anesthesia: LOCAL

## 2017-04-11 MED ORDER — CLOPIDOGREL BISULFATE 75 MG PO TABS: 75.0000 mg | ORAL_TABLET | Freq: Every day | ORAL | 0 refills | Status: AC

## 2017-04-11 MED ORDER — ATORVASTATIN CALCIUM 40 MG PO TABS: 40.0000 mg | ORAL_TABLET | Freq: Every day | ORAL | Status: DC

## 2017-04-11 MED ORDER — GLUCERNA SHAKE PO LIQD: 237.0000 mL | Freq: Two times a day (BID) | ORAL | Status: DC

## 2017-04-11 MED ORDER — CLOPIDOGREL BISULFATE 75 MG PO TABS
75.0000 mg | ORAL_TABLET | Freq: Every day | ORAL | Status: DC
  Administered 2017-04-11: 75 mg via ORAL
  Filled 2017-04-11: qty 1

## 2017-04-11 MED ORDER — ATORVASTATIN CALCIUM 40 MG PO TABS: 40.0000 mg | ORAL_TABLET | Freq: Every day | ORAL | 0 refills | Status: AC

## 2017-04-11 MED ORDER — LABETALOL HCL 100 MG PO TABS
150.0000 mg | ORAL_TABLET | Freq: Two times a day (BID) | ORAL | Status: DC
  Administered 2017-04-11: 150 mg via ORAL
  Filled 2017-04-11: qty 2

## 2017-04-11 MED ORDER — LABETALOL HCL 300 MG PO TABS: 150.0000 mg | ORAL_TABLET | Freq: Two times a day (BID) | ORAL | 0 refills | Status: DC

## 2017-04-11 MED ORDER — NITROGLYCERIN 0.4 MG SL SUBL: 0.4000 mg | SUBLINGUAL_TABLET | SUBLINGUAL | 3 refills | Status: AC | PRN

## 2017-04-11 MED ORDER — ASPIRIN EC 81 MG PO TBEC
81.0000 mg | DELAYED_RELEASE_TABLET | Freq: Every day | ORAL | Status: DC
  Administered 2017-04-11: 81 mg via ORAL
  Filled 2017-04-11: qty 1

## 2017-04-11 NOTE — Progress Notes (Signed)
Initial Nutrition Assessment  DOCUMENTATION CODES:   Not applicable  INTERVENTION:   -Glucerna Shake po BID, each supplement provides 220 kcal and 10 grams of protein  NUTRITION DIAGNOSIS:   Increased nutrient needs related to chronic illness(COPD) as evidenced by estimated needs.  GOAL:   Patient will meet greater than or equal to 90% of their needs  MONITOR:   PO intake, Supplement acceptance, Labs, Weight trends, Skin, I & O's  REASON FOR ASSESSMENT:   Malnutrition Screening Tool    ASSESSMENT:   Jeff Montes is a 77 y.o. male with a Past Medical History significant for hypertension, COPD, aortic aneurysm, remote brain tumor surgery who [facility].  Pt admitted for NSTEMI and CVA.  Spoke with pt and daughter at bedside. Pt reports good appetite presently. He reports he typically consumes 3-4 meals per day, which are prepared by his wife. Meals typically consist of a meat, starch, and vegetables. Pt daughter estimated that pt consumed about 75% of breakfast meal without difficulty. Per pt and daughter, pt underwent BSE evaluation and was cleared for a regular consistency diet.   Per pt daughter, pt has experienced a 25# wt loss over the past 1.5 years, which she suspects is to decreased appetite and decline in mobility. Pt still eats the same types of foods at home, however, she has noticed that portions are smaller. Additionally, she has noticed that pt has decreased balance and endurance (walks with a cancer at baseline). Reviewed wt hx; noted pt has experienced a 6% wt loss over the past 6 months, which is not significant for time frame. Pt shares he has always been slender ("the most I have every weighed is 170#").   Pt consumed 75% of Ensure supplement, which he enjoyed. Discussed with pt and daughter importance of good meal and supplement intake to promote healing. Educated pt and daughter on how to increase  protein intake in diet.   Labs reviewed.   Labs reviewed.   NUTRITION - FOCUSED PHYSICAL EXAM:    Most Recent Value  Orbital Region  No depletion  Upper Arm Region  Mild depletion  Thoracic and Lumbar Region  No depletion  Buccal Region  Mild depletion  Temple Region  Mild depletion  Clavicle Bone Region  No depletion  Clavicle and Acromion Bone Region  No depletion  Scapular Bone Region  Mild depletion  Dorsal Hand  Mild depletion  Patellar Region  Mild depletion  Anterior Thigh Region  Mild depletion  Posterior Calf Region  Mild depletion  Edema (RD Assessment)  None  Hair  Reviewed  Eyes  Reviewed  Mouth  Reviewed  Skin  Reviewed  Nails  Reviewed       Diet Order:  Fall precautions Diet heart healthy/carb modified Room service appropriate? Yes; Fluid consistency: Thin Diet - low sodium heart healthy  EDUCATION NEEDS:   Education needs have been addressed  Skin:  Skin Assessment: Reviewed RN Assessment  Last BM:  PTA  Height:   Ht Readings from Last 1 Encounters:  04/10/17 5\' 8"  (1.727 m)    Weight:   Wt Readings from Last 1 Encounters:  04/10/17 139 lb 1.8 oz (63.1 kg)    Ideal Body Weight:  70 kg  BMI:  Body mass index is 21.15 kg/m.  Estimated Nutritional Needs:   Kcal:  1700-1900  Protein:  85-100 grams  Fluid:  1.7-1.9 L    Leonard Feigel A. Mayford KnifeWilliams, RD,  LDN, CDE Pager: [phone] After hours Pager: [phone]

## 2017-04-11 NOTE — Care Management Obs Status (Signed)
MEDICARE OBSERVATION STATUS NOTIFICATION   Patient Details  Name: Jeff Montes MRN: [id number] Date of Birth: 1940/10/05   Medicare Observation Status Notification Given:  Yes    Cherylann ParrClaxton, Yoselyn Mcglade S, RN 04/11/2017, 2:52 PM

## 2017-04-11 NOTE — Care Management CC44 (Signed)
Condition Code 44 Documentation Completed  Patient Details  Name: Krystal EatonJesse Natividad MRN: [id number] Date of Birth: 31-Jul-1940   Condition Code 44 given:  Yes Patient signature on Condition Code 44 notice:  Yes Documentation of 2 MD's agreement:  Yes Code 44 added to claim:  Yes    Cherylann ParrClaxton, Codey Burling S, RN 04/11/2017, 2:52 PM

## 2017-04-11 NOTE — Care Management Note (Signed)
Case Management Note  Patient Details  Name: Krystal EatonJesse Boreman MRN: [id number] Date of Birth: 07/09/1940  Subjective/Objective:    Pt admitted with CP                Action/Plan:  Pt independent form home with wife , pt lives with his wife.  [facility] therapy are recommended at this time - family has been instructed to contact PCP if pt has difficulty once he arrives home but intervention is not deemed necessary by speech therapy at this time - family in agreement with plan   Expected Discharge Date:  04/11/17               Expected Discharge Plan:  Home/Self Care  In-House Referral:     Discharge planning Services  CM Consult  Post Acute Care Choice:    Choice offered to:     DME Arranged:    DME Agency:     HH Arranged:    HH Agency:     Status of Service:  Completed, signed off  If discussed at [employer] Meetings, dates discussed:    Additional Comments:  Cherylann ParrClaxton, Lido Maske S, RN 04/11/2017, 2:52 PM

## 2017-04-11 NOTE — Evaluation (Signed)
Physical Therapy Evaluation Patient Details Name: Jeff Montes MRN: [id number] DOB: [birth year] Today's Date: 04/11/2017   History of Present Illness  Pt adm with lt sided weakness. Pt found to have NSTEMI vs demand ischemia. MRI showed 2  small acute/subacute ischemic infarctions in the right posterior parietal and occipital lobes.  Pt declined further invasive work up. PMH - CKD, benign brain tumor, TKR, thoracic aortic aneurysm  Clinical Impression  Pt presents to PT at baseline for mobility. Pt ready for dc home    Follow Up Recommendations No PT follow up;Supervision for mobility/OOB    Equipment Recommendations  None recommended by PT    Recommendations for Other Services       Precautions / Restrictions Precautions Precautions: Fall Restrictions Weight Bearing Restrictions: No      Mobility  Bed Mobility               General bed mobility comments: Pt up in chair  Transfers Overall transfer level: Needs assistance Equipment used: Straight cane Transfers: Sit to/from Stand Sit to Stand: Supervision         General transfer comment: supervision for safety  Ambulation/Gait Ambulation/Gait assistance: Supervision Ambulation Distance (Feet): 150 Feet Assistive device: Straight cane Gait Pattern/deviations: Decreased step length - right;Decreased step length - left;Shuffle;Trunk flexed Gait velocity: decr Gait velocity interpretation: <1.8 ft/sec, indicative of risk for recurrent falls General Gait Details: slow shuffling gait. Slightly unsteady but no overt loss of balance  Stairs            Wheelchair Mobility    Modified Rankin (Stroke Patients Only)       Balance Overall balance assessment: Needs assistance Sitting-balance support: No upper extremity supported;Feet supported Sitting balance-Leahy Scale: Good     Standing balance support: No upper extremity supported Standing balance-Leahy Scale: Fair                                Pertinent Vitals/Pain Pain Assessment: No/denies pain    Home Living Family/patient expects to be discharged to:: Private residence Living Arrangements: Spouse/significant other Available Help at Discharge: Family;Friend(s);Available 24 hours/day Type of Home: House Home Access: Ramped entrance     Home Layout: Two level;Laundry or work area in basement;Able to live on main level with bedroom/bathroom Home Equipment: Environmental consultant - 2 wheels;Cane - single point      Prior Function Level of Independence: Independent with assistive device(s)         Comments: amb with cane. Reports 1 fall in past 6 months     Hand Dominance        Extremity/Trunk Assessment   Upper Extremity Assessment Upper Extremity Assessment: Defer to OT evaluation    Lower Extremity Assessment Lower Extremity Assessment: Generalized weakness       Communication   Communication: HOH  Cognition Arousal/Alertness: Awake/alert Behavior During Therapy: WFL for tasks assessed/performed Overall Cognitive Status: History of cognitive impairments - at baseline                                        General Comments      Exercises     Assessment/Plan    PT Assessment Patent does not need any further PT services  PT Problem List         PT Treatment Interventions      PT Goals (Current  goals can be found in the Care Plan section)  Acute Rehab PT Goals PT Goal Formulation: All assessment and education complete, DC therapy    Frequency     Barriers to discharge        Co-evaluation               AM-PAC PT "6 Clicks" Daily Activity  Outcome Measure Difficulty turning over in bed (including adjusting bedclothes, sheets and blankets)?: A Little Difficulty moving from lying on back to sitting on the side of the bed? : A Little Difficulty sitting down on and standing up from a chair with arms (e.g., wheelchair, bedside commode, etc,.)?: A Little Help needed  moving to and from a bed to chair (including a wheelchair)?: A Little Help needed walking in hospital room?: A Little Help needed climbing 3-5 steps with a railing? : A Little 6 Click Score: 18    End of Session   Activity Tolerance: Patient tolerated treatment well Patient left: in chair;with call bell/phone within reach;with family/visitor present   PT Visit Diagnosis: Unsteadiness on feet (R26.81);Other abnormalities of gait and mobility (R26.89)    Time: [id number] PT Time Calculation (min) (ACUTE ONLY): 19 min   Charges:   PT Evaluation $PT Eval Low Complexity: 1 Low     PT G CodesMarland Kitchen:        [facility] HospitalCary Nazar Kuan PT [phone]   Angelina OkCary W [facility] In SurgeryMaycok 04/11/2017, 3:13 PM

## 2017-04-11 NOTE — Evaluation (Signed)
Speech Language Pathology Evaluation Patient Details Name: Jeff Montes MRN: [id number] DOB: 12-09-1940 Today's Date: 04/11/2017 Time: 1140-1200 SLP Time Calculation (min) (ACUTE ONLY): 20 min  Problem List:  Patient Active Problem List   Diagnosis Date Noted  . HTN (hypertension) 04/10/2017  . CVA (cerebral vascular accident) (HCC) 04/10/2017  . Elevated troponin I level 04/10/2017  . NSTEMI (non-ST elevated myocardial infarction) (HCC) 04/10/2017  . COPD (chronic obstructive pulmonary disease) (HCC) 10/07/2016  . CKD (chronic kidney disease) stage 3, GFR 30-59 ml/min (HCC) 10/05/2015  . Hypertensive CKD (chronic kidney disease) 05/31/2015  . SOB (shortness of breath) 05/29/2015  . AKI (acute kidney injury) (HCC) 11/24/2013  . Thoracoabdominal aortic aneurysm, without rupture (HCC) 11/24/2013  . History of colon polyps 05/06/2012  . Abnormal weight loss 01/15/2012  . Aortic dissection (HCC) 08/05/2011  . Renal artery stenosis (HCC) 08/05/2011   Past Medical History:  Past Medical History:  Diagnosis Date  . Abnormal weight loss 01/15/2012  . AKI (acute kidney injury) (HCC) 11/24/2013  . Aortic dissection (HCC) 08/05/2011  . CKD (chronic kidney disease) stage 3, GFR 30-59 ml/min (HCC) 10/05/2015  . Essential hypertension 05/31/2015  . History of colon polyps 05/06/2012  . Renal artery stenosis (HCC) 08/05/2011  . SOB (shortness of breath) 05/29/2015  . Thoracoabdominal aortic aneurysm, without rupture (HCC) 11/24/2013   Past Surgical History:  Past Surgical History:  Procedure Laterality Date  . facial nerve tumor revmoval    . TONSILLECTOMY    . TOTAL KNEE ARTHROPLASTY     HPI:  77 year old male admitted 04/10/17 with NSTEMI and CVA. 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   Assessment / Plan / Recommendation Clinical Impression  The Mini-Mental State Exam (MMSE) was administered, as  well as the clock drawing test. Pt scored 26/30 (n=26+/30) indicating performance within functional limits for pt age and education (9th grade). Pt's daughter reports he is at his baseline re: com/cog status. Points were lost on delayed recall and attention/calculation subtests. Pt/daughter were encouraged to notify PCP if difficulties arise once pt is DC'd and returns to normal routines. Outpatient or home health therapy may be beneficial.     SLP Assessment  SLP Recommendation/Assessment: All further Speech Language Pathology  needs can be addressed in the next venue of care if needs arise SLP Visit Diagnosis: Attention and concentration deficit;Cognitive communication deficit (R41.841)    Follow Up Recommendations  Home health SLP;Outpatient SLP(if needs arise)       SLP Evaluation Cognition  Overall Cognitive Status: History of cognitive impairments - at baseline Arousal/Alertness: Awake/alert Orientation Level: Oriented X4 Attention: Focused;Sustained Focused Attention: Appears intact Sustained Attention: Appears intact Memory: Impaired Memory Impairment: Storage deficit;Retrieval deficit;Decreased recall of new information;Decreased short term memory Decreased Short Term Memory: Verbal basic Problem Solving: Impaired Problem Solving Impairment: Verbal basic       Comprehension  Auditory Comprehension Overall Auditory Comprehension: Appears within functional limits for tasks assessed    Expression Expression Primary Mode of Expression: Verbal Verbal Expression Overall Verbal Expression: Appears within functional limits for tasks assessed   Oral / Motor  Oral Motor/Sensory Function Overall Oral Motor/Sensory Function: (baseline left facial weakness from brain tumor/surgery) Motor Speech Intelligibility: Intelligible   GO                   Jeff Montes, [facility]ty SurgeryMSP, CCC-SLP Speech Language Pathologist [phone]  Jeff Montes, Jeff Montes 04/11/2017, 1:22 PM

## 2017-04-11 NOTE — Evaluation (Signed)
Clinical/Bedside Swallow Evaluation Patient Details  Name: Jeff Montes MRN: [id number] Date of Birth: Aug 24, 1940  Today's Date: 04/11/2017 Time: SLP Start Time (ACUTE ONLY): 1120 SLP Stop Time (ACUTE ONLY): 1140 SLP Time Calculation (min) (ACUTE ONLY): 20 min  Past Medical History:  Past Medical History:  Diagnosis Date  . Abnormal weight loss 01/15/2012  . AKI (acute kidney injury) (HCC) 11/24/2013  . Aortic dissection (HCC) 08/05/2011  . CKD (chronic kidney disease) stage 3, GFR 30-59 ml/min (HCC) 10/05/2015  . Essential hypertension 05/31/2015  . History of colon polyps 05/06/2012  . Renal artery stenosis (HCC) 08/05/2011  . SOB (shortness of breath) 05/29/2015  . Thoracoabdominal aortic aneurysm, without rupture (HCC) 11/24/2013   Past Surgical History:  Past Surgical History:  Procedure Laterality Date  . facial nerve tumor revmoval    . TONSILLECTOMY    . TOTAL KNEE ARTHROPLASTY     HPI:  77 year old male admitted 04/10/17 with NSTEMI and CVA. 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   Assessment / Plan / Recommendation Clinical Impression  Pt presents with baseline left facial weakness, following surgery in 2001 for benign brain tumor. Oral motor strength and function otherwise functional. Trials of thin liquid, puree, and solid were tolerated well without overt s/s aspiration. Pt's daughter reported pt coughs intermittently, but that is unchanged from years before. She feels he is currently at his baseline level of function. Recommend continuing with regular diet and thin liquids, routine safe swallow precautions. No further ST intervention recommended at this time. Please reconsult if needs arise.   SLP Visit Diagnosis: Dysphagia, unspecified (R13.10)    Aspiration Risk  Mild aspiration risk    Diet Recommendation Regular;Thin liquid   Liquid Administration via: Straw Medication  Administration: Whole meds with liquid Supervision: Patient able to self feed Compensations: Minimize environmental distractions;Small sips/bites;Slow rate Postural Changes: Seated upright at 90 degrees    Other  Recommendations Oral Care Recommendations: Oral care BID   Follow up Recommendations Home health SLP;Outpatient SLP(if needs arise)          Prognosis Prognosis for Safe Diet Advancement: Good      Swallow Study   General Date of Onset: 04/10/17 HPI: 77 year old male admitted 04/10/17 with NSTEMI and CVA. 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 Type of Study: Bedside Swallow Evaluation Previous Swallow Assessment: none Diet Prior to this Study: Regular;Thin liquids Temperature Spikes Noted: No Respiratory Status: Nasal cannula;Room air History of Recent Intubation: No Behavior/Cognition: Alert;Cooperative;Pleasant mood Oral Cavity Assessment: Within Functional Limits Oral Care Completed by SLP: No Oral Cavity - Dentition: Adequate natural dentition Vision: Functional for self-feeding Self-Feeding Abilities: Able to feed self Patient Positioning: Upright in bed Baseline Vocal Quality: Normal Volitional Cough: Weak Volitional Swallow: Able to elicit    Oral/Motor/Sensory Function Overall Oral Motor/Sensory Function: (left facial weakness, baselien since 2001 brain tumor surgery)   Ice Chips Ice chips: Not tested   Thin Liquid Thin Liquid: Within functional limits Presentation: Straw    Nectar Thick Nectar Thick Liquid: Not tested   Honey Thick Honey Thick Liquid: Not tested   Puree Puree: Within functional limits Presentation: Self Fed;Spoon   Solid   GO   Solid: Within functional limits Presentation: Self Fed       Celia B. Murvin NatalBueche, [facility] FrederickMSP, CCC-SLP Speech Language Pathologist [phone]  Wanda PlumpBueche, Celia  Brown 04/11/2017,1:30 PM

## 2017-04-11 NOTE — Discharge Summary (Signed)
Physician Discharge Summary  Jeff Montes ZOX:[id number] DOB: [birth year] DOA: 04/10/2017  PCP: Marylynn Pearson, MD  Admit date: 04/10/2017 Discharge date: 04/11/2017  Admitted From: Patient  was a transfer from [place].  Disposition:  Home   Recommendations for Outpatient Follow-up:  1. Follow up with PCP in 1-2 weeks 2. Please obtain BMP/CBC in one week    Discharge Condition: stable.  CODE STATUS: DNR Diet recommendation: Heart Healthy   Brief/Interim Summary:  HPI: Jeff Montes is a 77 y.o. 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 Patient reported that since [place] he was not feeling good, complaining of malaise and mild nausea. He developed left sided tingling, numbness upon waking up yesterday and was seen at his PCP office from where he was referred to the [facility] for CVA/TIA evaluation. MRI showed 2 small acute/subacute infarcts in the right posterior parietal and occipital region. His daughter reported that patient  Did not have any focal weakness or slurred speech more than at baseline.  This morning his troponin bumped up to 10, EKG did not demonstrate any acute changes. Echo was concerning for WMA.and patient was transferred to [facility] for further treatment. He denied chest pain, SOB, PND, jaw claudication Cardiology and Neurology services were notified about patient's admission and will see him today    NSTEMI vs [place]and ischemia. - patient is chest pain free. Troponin trending down.  Evaluated by cardiology who recommended Cath. Patient and family does not wishes to proceed with cath.  Discharge on Plavix, aspirin, statins and labetalol. Medical management   CVA -evaluated by neurology, MRI finding does not explain neuro symptoms. patient will be on Plavix for secondary stroke prevention. Statins.   HTN -  continue with labetalol.   CKD stage III in patient with known history of RAS last cr 6 months ago was at 2.  Cr stable at 1.9.     Discharge Diagnoses:  Principal Problem:   NSTEMI (non-ST elevated myocardial infarction) (HCC) Active Problems:   CKD (chronic kidney disease) stage 3, GFR 30-59 ml/min (HCC)   Renal artery stenosis (HCC)   HTN (hypertension)   CVA (cerebral vascular accident) (HCC)   Elevated troponin I level    Discharge Instructions  Discharge Instructions    Diet - low sodium heart healthy   Complete by:  As directed    Increase activity slowly   Complete by:  As directed      Allergies as of 04/11/2017      Reactions   Acyclovir And Related       Medication List    TAKE these medications   aspirin EC 81 MG tablet Take 81 mg by mouth daily.   atorvastatin 40 MG tablet Commonly known as:  LIPITOR Take 1 tablet (40 mg total) by mouth daily at 6 PM.   clopidogrel 75 MG tablet Commonly known as:  PLAVIX Take 1 tablet (75 mg total) by mouth daily. Start taking on:  04/12/2017   fluticasone 50 MCG/ACT nasal spray Commonly known as:  FLONASE Place 2 sprays into both nostrils daily.   labetalol 300 MG tablet Commonly known as:  NORMODYNE Take 0.5 tablets (150 mg total) by mouth 2 (two) times daily. What changed:  how much to take   nitroGLYCERIN 0.4 MG SL tablet Commonly known as:  NITROSTAT Place 1 tablet (0.4 mg total) under the tongue every 5 (  five) minutes as needed for chest pain.   tamsulosin 0.4 MG Caps capsule Commonly known as:  FLOMAX Take 0.4 mg by mouth daily.   umeclidinium-vilanterol 62.5-25 MCG/INH Aepb Commonly known as:  ANORO ELLIPTA Inhale 1 puff into the lungs daily.   vitamin B-12 1000 MCG tablet Commonly known as:  CYANOCOBALAMIN Take 2,500 mcg by mouth daily.       Allergies  Allergen Reactions  . Acyclovir And Related     Consultations: Cardiology Neurology   Procedures/Studies: Mr Shirlee Latch NW  Contrast  Result Date: 04/11/2017 CLINICAL DATA:  Stroke.  Weakness and numbness right side. EXAM: MRA HEAD WITHOUT CONTRAST TECHNIQUE: Angiographic images of the Circle of Willis were obtained using MRA technique without intravenous contrast. COMPARISON:  MRI head 04/09/2017 FINDINGS: Internal carotid artery widely patent. Anterior middle cerebral arteries normal without stenosis or occlusion Both vertebral arteries patent to the basilar. Basilar widely patent. Left PICA patent. Right PICA not visualized. Right AICA patent. Bilateral superior cerebellar and posterior cerebral arteries widely patent. Negative for cerebral aneurysm. IMPRESSION: Nonvisualization right PICA.  Otherwise negative. Electronically Signed   By: Marlan Palau M.D.   On: 04/11/2017 09:08      Subjective: He is chest pain free. Neurological intact   Discharge Exam: Vitals:   04/11/17 0726 04/11/17 1134  BP: (!) 142/94 133/86  Pulse: 74 68  Resp: 16 20  Temp: 98.5 F (36.9 C) 98.3 F (36.8 C)  SpO2: 95% 96%   Vitals:   04/11/17 0400 04/11/17 0700 04/11/17 0726 04/11/17 1134  BP: 135/72 138/83 (!) 142/94 133/86  Pulse: 64 70 74 68  Resp: 15 18 16 20   Temp:   98.5 F (36.9 C) 98.3 F (36.8 C)  TempSrc:   Oral Oral  SpO2: 94% 93% 95% 96%  Weight:      Height:        General: Pt is alert, awake, not in acute distress Cardiovascular: RRR, S1/S2 +, no rubs, no gallops Respiratory: CTA bilaterally, no wheezing, no rhonchi Abdominal: Soft, NT, ND, bowel sounds + Extremities: no edema, no cyanosis    The results of significant diagnostics from this hospitalization (including imaging, microbiology, ancillary and laboratory) are listed below for reference.     Microbiology: Recent Results (from the past 240 hour(s))  MRSA PCR Screening     Status: None   Collection Time: 04/10/17  4:51 PM  Result Value Ref Range Status   MRSA by PCR NEGATIVE NEGATIVE Final    Comment:        The GeneXpert MRSA Assay  (FDA approved for NASAL specimens only), is one component of a comprehensive MRSA colonization surveillance program. It is not intended to diagnose MRSA infection nor to guide or monitor treatment for MRSA infections. Performed at [facility], [address], [place], [place] [address]      Labs: BNP (last 3 results) No results for input(s): BNP in the last 8760 hours. Basic Metabolic Panel: Recent Labs  Lab 04/10/17 2000 04/11/17 1203  NA 138 140  K 4.5 4.4  CL 104 104  CO2 22 26  GLUCOSE 99 139*  BUN 24* 25*  CREATININE 1.83* 1.95*  CALCIUM 8.7* 8.8*   Liver Function Tests: Recent Labs  Lab 04/11/17 1203  AST 25  ALT 19  ALKPHOS 98  BILITOT 0.8  PROT 5.7*  ALBUMIN 3.1*   No results for input(s): LIPASE, AMYLASE in the last 168 hours. No results for input(s): AMMONIA in the  last 168 hours. CBC: Recent Labs  Lab 04/10/17 2000 04/11/17 1203  WBC 9.7 8.8  NEUTROABS 7.5  --   HGB 14.0 14.0  HCT 44.0 44.0  MCV 90.0 89.8  PLT 214 218   Cardiac Enzymes: Recent Labs  Lab 04/10/17 1834 04/11/17 0024  TROPONINI 4.42* 3.64*   BNP: Invalid input(s): POCBNP CBG: No results for input(s): GLUCAP in the last 168 hours. D-Dimer No results for input(s): DDIMER in the last 72 hours. Hgb A1c Recent Labs    04/11/17 0024  HGBA1C 5.6   Lipid Profile Recent Labs    04/11/17 0024  CHOL 149  HDL 41  LDLCALC 91  TRIG 86  CHOLHDL 3.6   Thyroid function studies No results for input(s): TSH, T4TOTAL, T3FREE, THYROIDAB in the last 72 hours.  Invalid input(s): FREET3 Anemia work up No results for input(s): VITAMINB12, FOLATE, FERRITIN, TIBC, IRON, RETICCTPCT in the last 72 hours. Urinalysis No results found for: COLORURINE, APPEARANCEUR, LABSPEC, PHURINE, GLUCOSEU, HGBUR, BILIRUBINUR, KETONESUR, PROTEINUR, UROBILINOGEN, NITRITE, LEUKOCYTESUR Sepsis Labs Invalid input(s): PROCALCITONIN,  WBC,  LACTICIDVEN Microbiology Recent Results (from the  past 240 hour(s))  MRSA PCR Screening     Status: None   Collection Time: 04/10/17  4:51 PM  Result Value Ref Range Status   MRSA by PCR NEGATIVE NEGATIVE Final    Comment:        The GeneXpert MRSA Assay (FDA approved for NASAL specimens only), is one component of a comprehensive MRSA colonization surveillance program. It is not intended to diagnose MRSA infection nor to guide or monitor treatment for MRSA infections. Performed at [facility], [address] [place], [place] [address]      Time coordinating discharge: Over 30 minutes  SIGNED:   Alba CoryBelkys A Arlynn Mcdermid, MD  [facility] 04/11/2017, 2:29 PM Pager   If 7PM-7AM, please contact night-coverage [email or web address] Password [email or web address]

## 2017-04-11 NOTE — Progress Notes (Signed)
Pt being transported to MRI for MRA

## 2017-04-11 NOTE — Progress Notes (Signed)
OT Cancellation Note  Patient Details Name: Jeff EatonJesse Montes MRN: [id number] DOB: 18-Nov-1940   Cancelled Treatment:    Reason Eval/Treat Not Completed: OT screened, no needs identified, will sign off. Both SLP, PT, and daughter feel like Patient is at baseline generally. OT will sign off. Thank you for the referral.   Emelda FearLaura J Damario Gillie 04/11/2017, 3:37 PM  Sherryl MangesLaura Lander Eslick OTR/L [phone]

## 2017-04-11 NOTE — Progress Notes (Signed)
Explained and discussed discharge instructions with daugther. Prescriptions and follow up visits given. No needs needed at this time. Pt going home with daugther with belongings, cane via w/c.

## 2017-04-11 NOTE — Progress Notes (Signed)
STROKE TEAM PROGRESS NOTE   SUBJECTIVE (INTERVAL HISTORY) His daughter is at the bedside.  Overall he feels his condition is completely resolved. He stated that his right sided numbness has resolved. MRI showed right MCA/PCA territory 2 punctated DWI abnormal signal, concerning for stroke. Transferred here for cardiology evaluation of elevated troponin, but will not do cath as pt CKD and not willing to proceed.    OBJECTIVE Temp:  [98.1 F (36.7 C)-98.5 F (36.9 C)] 98.3 F (36.8 C) (02/15 1134) Pulse Rate:  [64-85] 68 (02/15 1134) Cardiac Rhythm: Normal sinus rhythm (02/15 0736) Resp:  [15-23] 20 (02/15 1134) BP: (133-175)/(72-113) 133/86 (02/15 1134) SpO2:  [93 %-97 %] 96 % (02/15 1134) Weight:  [139 lb 1.8 oz (63.1 kg)] 139 lb 1.8 oz (63.1 kg) (02/14 1644)  No results for input(s): GLUCAP in the last 168 hours. Recent Labs  Lab 04/10/17 2000 04/11/17 1203  NA 138 140  K 4.5 4.4  CL 104 104  CO2 22 26  GLUCOSE 99 139*  BUN 24* 25*  CREATININE 1.83* 1.95*  CALCIUM 8.7* 8.8*   Recent Labs  Lab 04/11/17 1203  AST 25  ALT 19  ALKPHOS 98  BILITOT 0.8  PROT 5.7*  ALBUMIN 3.1*   Recent Labs  Lab 04/10/17 2000 04/11/17 1203  WBC 9.7 8.8  NEUTROABS 7.5  --   HGB 14.0 14.0  HCT 44.0 44.0  MCV 90.0 89.8  PLT 214 218   Recent Labs  Lab 04/10/17 1834 04/11/17 0024  TROPONINI 4.42* 3.64*   Recent Labs    04/10/17 1834  LABPROT 13.1  INR 1.00   No results for input(s): COLORURINE, LABSPEC, PHURINE, GLUCOSEU, HGBUR, BILIRUBINUR, KETONESUR, PROTEINUR, UROBILINOGEN, NITRITE, LEUKOCYTESUR in the last 72 hours.  Invalid input(s): APPERANCEUR     Component Value Date/Time   CHOL 149 04/11/2017 0024   TRIG 86 04/11/2017 0024   HDL 41 04/11/2017 0024   CHOLHDL 3.6 04/11/2017 0024   VLDL 17 04/11/2017 0024   LDLCALC 91 04/11/2017 0024   Lab Results  Component Value Date   HGBA1C 5.6 04/11/2017   No results found for: LABOPIA, COCAINSCRNUR, LABBENZ, AMPHETMU,  THCU, LABBARB  No results for input(s): ETH in the last 168 hours.  I have personally reviewed the radiological images below and agree with the radiology interpretations.  Mr Maxine Glenn Head Wo Contrast  Result Date: 04/11/2017 CLINICAL DATA:  Stroke.  Weakness and numbness right side. EXAM: MRA HEAD WITHOUT CONTRAST TECHNIQUE: Angiographic images of the Circle of Willis were obtained using MRA technique without intravenous contrast. COMPARISON:  MRI head 04/09/2017 FINDINGS: Internal carotid artery widely patent. Anterior middle cerebral arteries normal without stenosis or occlusion Both vertebral arteries patent to the basilar. Basilar widely patent. Left PICA patent. Right PICA not visualized. Right AICA patent. Bilateral superior cerebellar and posterior cerebral arteries widely patent. Negative for cerebral aneurysm. IMPRESSION: Nonvisualization right PICA.  Otherwise negative. Electronically Signed   By: Marlan Palau M.D.   On: 04/11/2017 09:08   Carotid Doppler  Right ICA 50-69% stenosis, and left ICA < 50% stenosis  TTE normal LV function with mild mid septal wall hypokinesis along with mild to moderate mitral regurg  MRI brain 2 small acute/subacute ischemic infarctionsin the right posterior parietal and occipital lobes    PHYSICAL EXAM  Temp:  [98.1 F (36.7 C)-98.5 F (36.9 C)] 98.3 F (36.8 C) (02/15 1134) Pulse Rate:  [64-85] 68 (02/15 1134) Resp:  [15-23] 20 (02/15 1134) BP: (133-175)/(72-113) 133/86 (02/15  1134) SpO2:  [93 %-97 %] 96 % (02/15 1134) Weight:  [139 lb 1.8 oz (63.1 kg)] 139 lb 1.8 oz (63.1 kg) (02/14 1644)  General - Well nourished, well developed, in no apparent distress.  Ophthalmologic - fundi not visualized due to noncooperation.  Cardiovascular - Regular rate and rhythm with no murmur.  Mental Status -  Level of arousal and orientation to time, place, and person were intact. Language including expression, naming, repetition, comprehension was  assessed and found intact. Mild dysarthria Fund of Knowledge was assessed and was intact.  Cranial Nerves II - XII - II - Visual field intact OU. III, IV, VI - Extraocular movements intact. V - Facial sensation intact bilaterally. 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 X - Palate elevates symmetrically. XI - Chin turning & shoulder shrug intact bilaterally. XII - Tongue protrusion intact.  Motor Strength - The patient's strength was normal in all extremities and pronator drift was absent.  Bulk was normal and fasciculations were absent.   Motor Tone - Muscle tone was assessed at the neck and appendages and was normal.  Reflexes - The patient's reflexes were symmetrical in all extremities and he had no pathological reflexes.  Sensory - Light touch, temperature/pinprick were assessed and were symmetrical.    Coordination - The patient had normal movements in the hands with no ataxia or dysmetria.  Tremor was absent.  Gait and Station - deferred.   ASSESSMENT/PLAN Mr. Jeff Montes is a 77 y.o. 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 No tPA given due to out of window.    Stroke:  right MCA and MCA/PCA 2 punctate subacute infarct, likely secondary to small vessel disease, given their subcortical location and pt significant white matter ischemic changes on MRI. However, this can not explain his transient right sided numbness, which could be TIA  Resultant neuro intact except chronic left peripheral facial weakness  MRI  2 small acute/subacute ischemic infarctionsin the right posterior parietal and occipital lobes  MRA  Unremarkable   Carotid Doppler Right ICA 50-69% stenosis  2D Echo  EF normal  LDL 91  HgbA1c 5.6  lovenox for VTE prophylaxis  Fall  precautions  Diet heart healthy/carb modified Room service appropriate? Yes; Fluid consistency: Thin  Diet - low sodium heart healthy   aspirin 81 mg daily prior to admission, now on aspirin 81 mg daily and clopidogrel 75 mg daily. Continue DAPT for cardiac protection.   Patient counseled to be compliant with his antithrombotic medications  Ongoing aggressive stroke risk factor management  Therapy recommendations: none  Disposition:  Home  Right carotid stenosis  CUS showed right ICA 50-69% stenosis  Could be potentially source of right sided stroke  However, pt currently CKD and not willing to have contrast study including CTA or MRA neck with contrast - will not pursue at this time  Pt need outpt follow up with vascular surgery for monitoring  Elevated troponin  Troponin trending down but peaked at 10  [facility]  Pt currently refused any contrast-containing study due to CKD  On DAPT for cardiac protection  Hypertension Stable Permissive hypertension (OK if <220/120) for 24-48 hours post stroke and then gradually normalized within 5-7 days.  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 Please call with questions. Pt will follow up with Dr. Roda Shutters at [facility] in about 6 weeks. Thanks for the consult.   Marvel Plan, MD PhD Stroke Neurology 04/11/2017 3:06 PM    To contact Stroke Continuity provider, please refer to [email or web address]. After hours,  contact General Neurology

## 2017-04-11 NOTE — Plan of Care (Signed)
Continue current care plan 

## 2017-04-11 NOTE — Progress Notes (Signed)
[facility] with completing AD.  AD was done. Original copy given to patient and three copies for patient chosen agents, and one  Copy to nurse for chart.  Provided spiritual and emotional support.  Chaplain available as needed.    04/11/17 1113  Clinical Encounter Type  Visited With Patient and family together;Health care provider  Visit Type Initial;Spiritual support  Referral From Nurse  Spiritual Encounters  Spiritual Needs Literature;Emotional  Stress Factors  Patient Stress Factors None identified  Family Stress Factors None identified  Advance Directives (For Healthcare)  Does Patient Have a Medical Advance Directive? Yes  Type of Estate agentAdvance Directive Healthcare Power of [place];Living will  Copy of Healthcare Power of Attorney in Chart? Yes  Copy of Living Will in Chart? Yes  Jeff [place], Jeff Montes, Chaplain, [facility], Pager [phone]

## 2017-04-30 DIAGNOSIS — E785 Hyperlipidemia, unspecified: Secondary | ICD-10-CM | POA: Insufficient documentation

## 2017-04-30 NOTE — Progress Notes (Signed)
Cardiology Office Note:    Date:  05/01/2017   ID:  Jeff Montes, DOB 1940/11/22, MRN [id number]  PCP:  Marylynn Pearson, MD  Cardiologist:  Norman Herrlich, MD    Referring MD: Marylynn Pearson, MD    ASSESSMENT:    1. Elevated troponin I level   2. Cerebrovascular accident (CVA) due to thrombosis of right middle cerebral artery (HCC)   3. 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 CKD (chronic kidney disease) stage 3, GFR 30-59 ml/min (HCC)   5. 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 Dyslipidemia   7. PAD (peripheral artery disease) (HCC)    PLAN:    In order of problems listed above:  1. Multiple etiologies including CKD along with his acute stroke.  Clinically had a acute cardiac injury elevated troponin and not myocardial infarction.  I support his decision not to undergo coronary angiography and will continue medical treatment including antiplatelet beta-blocker and statin. 2. Stable medical therapy dual antiplatelet and statin 3. Severe he has been judged not to be a candidate for surgical intervention and does not want to follow-up with the vascular surgeon at this time 4. Stable 5. Poorly controlled I will have him increase his a.m. beta-blocker to 300 mg labetalol 150 in the evening and home blood pressures he may need to go back to his previous dose or add another agent such as calcium channel blocker 6. Continue statin 7. He has PAD and chronic limb ischemia he does not require any intervention at this time   Next appointment: I will plan to see him in 6 months or sooner if needed   Medication Adjustments/Labs and Tests Ordered: Current medicines are reviewed at length with the patient today.  Concerns regarding medicines are outlined above.  Orders Placed This Encounter  Procedures  . EKG 12-Lead   Meds ordered this encounter  Medications  . DISCONTD: labetalol (NORMODYNE) 300 MG tablet    Sig: Take 0.5 tablets  (150 mg total) by mouth 2 (two) times daily. Take 1 in AM = 300 mg daily    Dispense:  60 tablet    Refill:  3  . aspirin EC 81 MG tablet    Sig: Take 1 tablet (81 mg total) by mouth daily.    Dispense:  90 tablet    Refill:  2  . labetalol (NORMODYNE) 300 MG tablet    Sig: Take 0.5 tablets (150 mg total) by mouth 2 (two) times daily. Take 300 mg in the morning and 150 mg in the evening    Dispense:  60 tablet    Refill:  3    Chief Complaint  Patient presents with  . Follow-up    6 month flup appt.  Pt was recently in [facility] and [employer]   . Cerebrovascular Accident  . Hypertension  . Thoracic Aortic Aneurysm  . AAA    History of Present Illness:    Jeff Montes is a 77 y.o. male with a hx of elevated Troponin with recent stroke last seen by Dr Jens Som 04/10/17.  Admit date: 04/10/2017 Discharge date: 04/11/2017 Admitted From: Patient  was a transfer from [place].  Disposition:  Home  Recommendations for Outpatient Follow-up:  1. Follow up with PCP in 1-2 weeks 2. Please obtain BMP/CBC in one week Discharge Condition: stable.  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 Patient reported that since [place] he was not feeling good, complaining of malaise and mild nausea. He developed left sided tingling, numbness upon waking up yesterday and was seen at his PCP office from where [facility]. MRI showed 2 small acute/subacute infarctsin the right posterior parietal and occipital region.His daughter reported that patient Did not have any focal weakness or slurred speech more than at baseline.  This morning histroponin bumped up to 10,EKGdid not demonstrate anyacute  changes. Echowasconcerning for WMA, mild septal hypokinesiaand patient was transferred to [facility] for further treatment. Dr Jens Som 04/10/17: As above, patient seen and examined.  Briefly he is a 77 year old male with past medical history of hypertension, COPD, thoracic and abdominal aortic aneurysms, type B aortic dissection, chronic stage III kidney disease and recent CVA transferred from [facility] for elevated troponin.  Patient admitted to [facility] with complaints of "tingling" on right side.  No dysarthria or loss of strength/sensation.  MRI showed CVA.  Troponins were checked and increased to 10.  Echocardiogram showed preserved LV function, mild septal hypokinesis and mild to moderate mitral regurgitation.  Electrocardiogram shows sinus rhythm with no significant ST changes. 1 elevated troponin-I discussed this finding in depth with patient and his daughter.  This may be related to his CVA but could also be non-ST elevation myocardial infarction.  I discussed the risks and benefits of cardiac catheterization including CVA, death, myocardial infarction and worsening renal insufficiency.  The patient stated he will never to consider any procedure that requires intravenous dye.  I explained the risk of undiagnosed coronary disease including myocardial infarction and death and he is adamant about not proceeding with catheterization.  It would therefore also not be helpful to pursue functional study as he would not be agreeable to cath if study abnormal.  Would treat medically.  Continue aspirin, beta-blocker and would add statin.  ASSESSMENT: Dr Dulce Sellar 10/07/17   1. 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 Chronic obstructive pulmonary disease, unspecified COPD type (HCC)    PLAN:    1.   Stable he is on optimal medical therapy with beta blocker heart rate and blood pressure at target and has         declined elective surgical intervention 8. Stable blood pressure target continue beta blocker and recheck BMP for kidney function 9. Stable continue current therapy  I spent 40 minutes reviewing his records from [facility] neurology cardiology radiology imaging echocardiogram and laboratory studies prior to seeing the patient today after recent complex hospitalization Compliance with diet, lifestyle and medications: yes  He sustained a recent stroke.  He had no chest pain no specific EKG changes and no prominent echocardiogram findings it was commented to him very mild septal hypokinesia.  On the base of this he was diagnosed as myocardial infarction went on possible transfer to [facility].  The family said they were unaware that he has been transferred to undergo coronary angiography.  He has underlying CKD they are aware of his chronic illness and poor prognosis and very adamantly did not want to undergo invasive procedures or be resuscitated.  He was seen stabilized careful by neurology and discharged home.  Previously his aspirin was switched to Plavix his dose of beta-blocker was decreased to 50% and his blood pressure  has been elevated since he returned home.  His right side is dysesthesia has improved he has swallowing difficulty and his feet are noted to be dusky and cold by his wife.  He has had no shortness of breath chest pain palpitation or syncope. Past Medical History:  Diagnosis Date  . Abnormal weight loss 01/15/2012  . AKI (acute kidney injury) (HCC) 11/24/2013  . Aortic dissection (HCC) 08/05/2011  . CKD (chronic kidney disease) stage 3, GFR 30-59 ml/min (HCC) 10/05/2015  . Essential hypertension 05/31/2015  . History of colon polyps 05/06/2012  . Renal artery stenosis (HCC) 08/05/2011  . SOB (shortness of breath) 05/29/2015  . 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 facial  nerve tumor revmoval    . TONSILLECTOMY    . TOTAL KNEE ARTHROPLASTY      Current Medications: Current Meds  Medication Sig  . atorvastatin (LIPITOR) 40 MG tablet Take 1 tablet (40 mg total) by mouth daily at 6 PM.  . clopidogrel (PLAVIX) 75 MG tablet Take 1 tablet (75 mg total) by mouth daily.  . fluticasone (FLONASE) 50 MCG/ACT nasal spray Place 2 sprays into both nostrils daily.  Marland Kitchen labetalol (NORMODYNE) 300 MG tablet Take 0.5 tablets (150 mg total) by mouth 2 (two) times daily. Take 300 mg in the morning and 150 mg in the evening  . nitroGLYCERIN (NITROSTAT) 0.4 MG SL tablet Place 1 tablet (0.4 mg total) under the tongue every 5 (five) minutes as needed for chest pain.  . tamsulosin (FLOMAX) 0.4 MG CAPS capsule Take 0.4 mg by mouth daily.  Marland Kitchen umeclidinium-vilanterol (ANORO ELLIPTA) 62.5-25 MCG/INH AEPB Inhale 1 puff into the lungs daily.  . vitamin B-12 (CYANOCOBALAMIN) 1000 MCG tablet Take 2,500 mcg by mouth daily.   . [DISCONTINUED] labetalol (NORMODYNE) 300 MG tablet Take 0.5 tablets (150 mg total) by mouth 2 (two) times daily.  . [DISCONTINUED] labetalol (NORMODYNE) 300 MG tablet Take 0.5 tablets (150 mg total) by mouth 2 (two) times daily. Take 1 in AM = 300 mg daily     Allergies:   Acyclovir and related   Social History   Socioeconomic History  . Marital status: Married    Spouse name: None  . Number of children: None  . Years of education: None  . Highest education level: None  Social Needs  . Financial resource strain: None  . Food insecurity - worry: None  . Food insecurity - inability: None  . Transportation needs - medical: None  . Transportation needs - non-medical: None  Occupational History  . None  Tobacco Use  . Smoking status: Current Every Day Smoker    Packs/day: 0.50    Years: 63.00    Pack years: 31.50    Types: Cigarettes  . Smokeless tobacco: Never Used  Substance and Sexual Activity  . Alcohol use: No  . Drug use: No  . Sexual activity: None    Other Topics Concern  . None  Social History Narrative  . None     Family History: The patient's family history includes Aortic aneurysm in his brother; Aortic dissection in his brother; Heart disease in his mother. ROS:   Please see the history of present illness.    All other systems reviewed and are negative.  EKGs/Labs/Other Studies Reviewed:    The following studies were reviewed today:  EKG:  EKG ordered today.  The ekg ordered today demonstrates sinus rhythm right bundle branch block  minor ST-T abnormality nonspecific  Recent Labs: 04/11/2017: ALT 19; BUN 25; Creatinine, Ser 1.95; Hemoglobin 14.0; Platelets 218; Potassium 4.4; Sodium 140  Recent Lipid Panel    Component Value Date/Time   CHOL 149 04/11/2017 0024   TRIG 86 04/11/2017 0024   HDL 41 04/11/2017 0024   CHOLHDL 3.6 04/11/2017 0024   VLDL 17 04/11/2017 0024   LDLCALC 91 04/11/2017 0024    Physical Exam:    VS:  BP (!) 162/96 (BP Location: Left Arm, Patient Position: Sitting, Cuff Size: Normal)   Pulse 64   Ht 5\' 7"  (1.702 m)   Wt 144 lb (65.3 kg)   SpO2 99%   BMI 22.55 kg/m     Wt Readings from Last 3 Encounters:  05/01/17 144 lb (65.3 kg)  04/10/17 139 lb 1.8 oz (63.1 kg)  10/07/16 142 lb 1.9 oz (64.5 kg)     GEN: He looks quite elderly and frail he is mildly dysarthric well nourished, well developed in no acute distress HEENT: Normal NECK: No JVD; No carotid bruits LYMPHATICS: No lymphadenopathy CARDIAC: RRR, no murmurs, rubs, gallops RESPIRATORY:  Clear to auscultation without rales, wheezing or rhonchi  ABDOMEN: Soft, non-tender, non-distended MUSCULOSKELETAL:  No edema; No deformity  SKIN: Lower extremities are cool from foot to the knee his toes are dusky I cannot palpate foot pulses with sensation and motor is intact and they improved with elevation. NEUROLOGIC:  Alert and oriented x 3 PSYCHIATRIC:  Normal affect    Signed, Norman HerrlichBrian Felicha Frayne, MD  05/01/2017 5:06 PM    [facility]

## 2017-05-01 ENCOUNTER — Encounter: Payer: Self-pay | Admitting: Cardiology

## 2017-05-01 ENCOUNTER — Ambulatory Visit: Payer: Medicare Other | Admitting: Cardiology

## 2017-05-01 VITALS — BP 162/96 | HR 64 | Ht 67.0 in | Wt 144.0 lb

## 2017-05-01 DIAGNOSIS — I716 Thoracoabdominal aortic aneurysm, without rupture, unspecified: Secondary | ICD-10-CM

## 2017-05-01 DIAGNOSIS — I63311 Cerebral infarction due to thrombosis of right middle cerebral artery: Secondary | ICD-10-CM | POA: Diagnosis not present

## 2017-05-01 DIAGNOSIS — I129 Hypertensive chronic kidney disease with stage 1 through stage 4 chronic kidney disease, or unspecified chronic kidney disease: Secondary | ICD-10-CM | POA: Diagnosis not present

## 2017-05-01 DIAGNOSIS — E785 Hyperlipidemia, unspecified: Secondary | ICD-10-CM

## 2017-05-01 DIAGNOSIS — N183 Chronic kidney disease, stage 3 unspecified: Secondary | ICD-10-CM

## 2017-05-01 DIAGNOSIS — R778 Other specified abnormalities of plasma proteins: Secondary | ICD-10-CM

## 2017-05-01 DIAGNOSIS — I739 Peripheral vascular disease, unspecified: Secondary | ICD-10-CM | POA: Insufficient documentation

## 2017-05-01 DIAGNOSIS — R7989 Other specified abnormal findings of blood chemistry: Principal | ICD-10-CM

## 2017-05-01 DIAGNOSIS — R748 Abnormal levels of other serum enzymes: Secondary | ICD-10-CM | POA: Diagnosis not present

## 2017-05-01 MED ORDER — LABETALOL HCL 300 MG PO TABS: 150.0000 mg | ORAL_TABLET | Freq: Two times a day (BID) | ORAL | 3 refills | Status: DC

## 2017-05-01 MED ORDER — LABETALOL HCL 300 MG PO TABS: 150.0000 mg | ORAL_TABLET | Freq: Two times a day (BID) | ORAL | 3 refills | Status: AC

## 2017-05-01 MED ORDER — ASPIRIN EC 81 MG PO TBEC: 81.0000 mg | DELAYED_RELEASE_TABLET | Freq: Every day | ORAL | 2 refills | Status: DC

## 2017-05-01 NOTE — Patient Instructions (Signed)
Medication Instructions:  Your physician has recommended you make the following change in your medication: START aspirin 81 mg daily INCREASE labetalol 300 mg in the morning and 150 mg in the evening   Labwork: None  Testing/Procedures: You had an EKG today.  Follow-Up: Your physician wants you to follow-up in: 6 months. You will receive a reminder letter in the mail two months in advance. If you don't receive a letter, please call our office to schedule the follow-up appointment.   Any Other Special Instructions Will Be Listed Below (If Applicable).     If you need a refill on your cardiac medications before your next appointment, please call your pharmacy.

## 2017-05-02 ENCOUNTER — Encounter: Payer: Medicare Other | Admitting: Vascular Surgery

## 2017-06-30 ENCOUNTER — Telehealth: Payer: Self-pay | Admitting: Cardiology

## 2017-06-30 NOTE — Telephone Encounter (Signed)
Advised patient's wife, Jeff Montes, to use cough medication without DM like coricidin HBP cough & cold. Jeff Montes verbalized understanding. No further questions.

## 2017-06-30 NOTE — Telephone Encounter (Signed)
Can Rhen take some cough syrup? Please call

## 2017-07-14 ENCOUNTER — Ambulatory Visit: Payer: Medicare Other | Admitting: Cardiology

## 2018-01-26 NOTE — Progress Notes (Signed)
Cardiology Office Note:    Date:  01/27/2018   ID:  Jeff Montes, DOB 1940/06/20, MRN [id number]  PCP:  Marylynn Pearson, MD  Cardiologist:  Norman Herrlich, MD    Referring MD: Marylynn Pearson, MD    ASSESSMENT:    1. Essential hypertension   2. 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 Chronic obstructive pulmonary disease, unspecified COPD type (HCC)   5. Dyslipidemia    PLAN:    In order of problems listed above:  1. Stable blood pressure by me at 128/80 right upper extremity continue current treatment high-dose labetalol as he is at target. 2. Known not a candidate for elective surgical intervention I would not reimage at this time 3. Stable check renal function 4. Stable no exacerbation 5. Stable continue his high intensity statin check liver function lipid profile. 6. Elevated troponin with stroke, presumed CAD having no anginal discomfort and continue treatment which includes clopidogrel high intensity statin and beta-blocker   Next appointment: 6 months   Medication Adjustments/Labs and Tests Ordered: Current medicines are reviewed at length with the patient today.  Concerns regarding medicines are outlined above.  No orders of the defined types were placed in this encounter.  No orders of the defined types were placed in this encounter.   Chief Complaint  Patient presents with  . Hypertension  . Chronic Kidney Disease    History of Present Illness:    Jeff Montes is a 77 y.o. male with a hx of hypertension, COPD, thoracic and abdominal aortic aneurysms, type B aortic dissection, chronic stage III kidney disease and  CVA with elevated troponin last seen 05/01/17 after that admission..He had no chest pain no specific EKG changes and no prominent echocardiogram findings it was commented to him very mild septal hypokinesia. On the base of this he was diagnosed as  myocardial infarction went on possible transfer to [facility]. The family said they were unaware that he has been transferred to undergo coronary angiography. He has underlying CKD they are aware of his chronic illness and poor prognosis and very adamantly did not want to undergo invasive procedures or be resuscitated.   Compliance with diet, lifestyle and medications: Yes  He is quite sedentary but remains at home has had no further hospitalizations and despite that admission to the hospital with elevated troponin has had no chest pain edema orthopnea palpitations syncope or TIA.  He has chronic exertional shortness of breath more than usual activities due to his underlying lung disease.  He has had no recent lab work since hospitalization although he did have a flu shot.  In general they are pleased with the quality of his life Past Medical History:  Diagnosis Date  . Abnormal weight loss 01/15/2012  . AKI (acute kidney injury) (HCC) 11/24/2013  . Aortic dissection (HCC) 08/05/2011  . CKD (chronic kidney disease) stage 3, GFR 30-59 ml/min (HCC) 10/05/2015  . Essential hypertension 05/31/2015  . History of colon polyps 05/06/2012  . Renal artery stenosis (HCC) 08/05/2011  . SOB (shortness of breath) 05/29/2015  . 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 facial nerve tumor revmoval    . TONSILLECTOMY    . TOTAL KNEE ARTHROPLASTY      Current Medications: Current Meds  Medication Sig  . atorvastatin (LIPITOR) 40 MG tablet Take 1 tablet (40 mg total)  by mouth daily at 6 PM.  . clopidogrel (PLAVIX) 75 MG tablet Take 1 tablet (75 mg total) by mouth daily.  . fluticasone (FLONASE) 50 MCG/ACT nasal spray Place 2 sprays into both nostrils daily.  Marland Kitchen ipratropium-albuterol (DUONEB) 0.5-2.5 (3) MG/3ML SOLN USE ONE vial in NEBULIZER FOUR TIMES DAILY  . labetalol (NORMODYNE) 300 MG tablet Take 0.5 tablets (150 mg total) by  mouth 2 (two) times daily. Take 300 mg in the morning and 150 mg in the evening  . nitroGLYCERIN (NITROSTAT) 0.4 MG SL tablet Place 1 tablet (0.4 mg total) under the tongue every 5 (five) minutes as needed for chest pain.  . tamsulosin (FLOMAX) 0.4 MG CAPS capsule Take 0.4 mg by mouth daily.  Marland Kitchen umeclidinium-vilanterol (ANORO ELLIPTA) 62.5-25 MCG/INH AEPB Inhale 1 puff into the lungs daily.  . vitamin B-12 (CYANOCOBALAMIN) 1000 MCG tablet Take 2,500 mcg by mouth daily.      Allergies:   Acyclovir and related   Social History   Socioeconomic History  . Marital status: Married    Spouse name: Not on file  . Number of children: Not on file  . Years of education: Not on file  . Highest education level: Not on file  Occupational History  . Not on file  Social Needs  . Financial resource strain: Not on file  . Food insecurity:    Worry: Not on file    Inability: Not on file  . Transportation needs:    Medical: Not on file    Non-medical: Not on file  Tobacco Use  . Smoking status: Current Every Day Smoker    Packs/day: 0.50    Years: 63.00    Pack years: 31.50    Types: Cigarettes  . Smokeless tobacco: Never Used  Substance and Sexual Activity  . Alcohol use: No  . Drug use: No  . Sexual activity: Not on file  Lifestyle  . Physical activity:    Days per week: Not on file    Minutes per session: Not on file  . Stress: Not on file  Relationships  . Social connections:    Talks on phone: Not on file    Gets together: Not on file    Attends religious service: Not on file    Active member of club or organization: Not on file    Attends meetings of clubs or organizations: Not on file    Relationship status: Not on file  Other Topics Concern  . Not on file  Social History Narrative  . Not on file     Family History: The patient's family history includes Aortic aneurysm in his brother; Aortic dissection in his brother; Heart disease in his mother. ROS:   Please see the  history of present illness.    All other systems reviewed and are negative.  EKGs/Labs/Other Studies Reviewed:    The following studies were reviewed today  Recent Labs: 04/11/2017: ALT 19; BUN 25; Creatinine, Ser 1.95; Hemoglobin 14.0; Platelets 218; Potassium 4.4; Sodium 140  Recent Lipid Panel    Component Value Date/Time   CHOL 149 04/11/2017 0024   TRIG 86 04/11/2017 0024   HDL 41 04/11/2017 0024   CHOLHDL 3.6 04/11/2017 0024   VLDL 17 04/11/2017 0024   LDLCALC 91 04/11/2017 0024    Physical Exam:    VS:  Pulse (!) 56   Ht 5\' 7"  (1.702 m)   Wt 134 lb 9.6 oz (61.1 kg)   SpO2 98%   BMI 21.08 kg/m  Wt Readings from Last 3 Encounters:  01/27/18 134 lb 9.6 oz (61.1 kg)  05/01/17 144 lb (65.3 kg)  04/10/17 139 lb 1.8 oz (63.1 kg)     GEN: He looks chronically ill frail well nourished, well developed in no acute distress HEENT: Normal NECK: No JVD; No carotid bruits LYMPHATICS: No lymphadenopathy CARDIAC:  RRR, no murmurs, rubs, gallops RESPIRATORY:  Clear to auscultation without rales, wheezing or rhonchi  ABDOMEN: Soft, non-tender, non-distended MUSCULOSKELETAL:  No edema; No deformity  SKIN: Warm and dry NEUROLOGIC:  Alert and oriented x 3 PSYCHIATRIC:  Normal affect    Signed, Norman HerrlichBrian Joyleen Haselton, MD  01/27/2018 1:46 PM    [facility]

## 2018-01-27 ENCOUNTER — Ambulatory Visit (INDEPENDENT_AMBULATORY_CARE_PROVIDER_SITE_OTHER): Payer: Medicare Other | Admitting: Cardiology

## 2018-01-27 ENCOUNTER — Encounter: Payer: Self-pay | Admitting: Cardiology

## 2018-01-27 VITALS — BP 128/60 | HR 56 | Ht 67.0 in | Wt 134.6 lb

## 2018-01-27 DIAGNOSIS — J449 Chronic obstructive pulmonary disease, unspecified: Secondary | ICD-10-CM | POA: Diagnosis not present

## 2018-01-27 DIAGNOSIS — I716 Thoracoabdominal aortic aneurysm, without rupture, unspecified: Secondary | ICD-10-CM

## 2018-01-27 DIAGNOSIS — N183 Chronic kidney disease, stage 3 (moderate): Secondary | ICD-10-CM

## 2018-01-27 DIAGNOSIS — E785 Hyperlipidemia, unspecified: Secondary | ICD-10-CM

## 2018-01-27 DIAGNOSIS — I129 Hypertensive chronic kidney disease with stage 1 through stage 4 chronic kidney disease, or unspecified chronic kidney disease: Secondary | ICD-10-CM

## 2018-01-27 DIAGNOSIS — I10 Essential (primary) hypertension: Secondary | ICD-10-CM

## 2018-01-27 NOTE — Patient Instructions (Addendum)
Medication Instructions: Your physician recommends that you continue on your current medications as directed. Please refer to the Current Medication list given to you today.  If you need a refill on your cardiac medications before your next appointment, please call your pharmacy.   Lab work: Your physician recommends that you return for lab work today: lipid panel, CMP, CBC.  If you have labs (blood work) drawn today and your tests are completely normal, you will receive your results only by: Marland Kitchen. MyChart Message (if you have MyChart) OR . A paper copy in the mail If you have any lab test that is abnormal or we need to change your treatment, we will call you to review the results.  Testing/Procedures: NONE  Follow-Up: At [facility], you and your health needs are our priority.  As part of our continuing mission to provide you with exceptional heart care, we have created designated Provider Care Teams.  These Care Teams include your primary Cardiologist (physician) and Advanced Practice Providers (APPs -  Physician Assistants and Nurse Practitioners) who all work together to provide you with the care you need, when you need it. You will need a follow up appointment in 6 months.  Please call our office 2 months in advance to schedule this appointment.

## 2018-01-28 LAB — COMPREHENSIVE METABOLIC PANEL
ALBUMIN: 4.3 g/dL (ref 3.5–4.8)
ALT: 9 IU/L (ref 0–44)
AST: 13 IU/L (ref 0–40)
Albumin/Globulin Ratio: 2.4 — ABNORMAL HIGH (ref 1.2–2.2)
Alkaline Phosphatase: 131 IU/L — ABNORMAL HIGH (ref 39–117)
BILIRUBIN TOTAL: 0.3 mg/dL (ref 0.0–1.2)
BUN / CREAT RATIO: 10 (ref 10–24)
BUN: 20 mg/dL (ref 8–27)
CHLORIDE: 106 mmol/L (ref 96–106)
CO2: 22 mmol/L (ref 20–29)
Calcium: 9.3 mg/dL (ref 8.6–10.2)
Creatinine, Ser: 1.97 mg/dL — ABNORMAL HIGH (ref 0.76–1.27)
GFR calc Af Amer: 36 mL/min/{1.73_m2} — ABNORMAL LOW (ref >59)
GFR calc non Af Amer: 31 mL/min/{1.73_m2} — ABNORMAL LOW (ref >59)
GLOBULIN, TOTAL: 1.8 g/dL (ref 1.5–4.5)
GLUCOSE: 98 mg/dL (ref 65–99)
Potassium: 4.7 mmol/L (ref 3.5–5.2)
SODIUM: 143 mmol/L (ref 134–144)
Total Protein: 6.1 g/dL (ref 6.0–8.5)

## 2018-01-28 LAB — LIPID PANEL
Chol/HDL Ratio: 2.5 ratio (ref 0.0–5.0)
Cholesterol, Total: 112 mg/dL (ref 100–199)
HDL: 44 mg/dL (ref >39)
LDL Calculated: 48 mg/dL (ref 0–99)
Triglycerides: 100 mg/dL (ref 0–149)
VLDL CHOLESTEROL CAL: 20 mg/dL (ref 5–40)

## 2018-01-28 LAB — CBC
Hematocrit: 41.9 % (ref 37.5–51.0)
Hemoglobin: 13.9 g/dL (ref 13.0–17.7)
MCH: 28.7 pg (ref 26.6–33.0)
MCHC: 33.2 g/dL (ref 31.5–35.7)
MCV: 86 fL (ref 79–97)
PLATELETS: 218 10*3/uL (ref 150–450)
RBC: 4.85 x10E6/uL (ref 4.14–5.80)
RDW: 14.1 % (ref 12.3–15.4)
WBC: 5.8 10*3/uL (ref 3.4–10.8)

## 2018-01-28 LAB — SPECIMEN STATUS REPORT

## 2018-06-05 DIAGNOSIS — E860 Dehydration: Secondary | ICD-10-CM | POA: Diagnosis not present

## 2018-06-05 DIAGNOSIS — W19XXXA Unspecified fall, initial encounter: Secondary | ICD-10-CM

## 2018-06-05 DIAGNOSIS — E875 Hyperkalemia: Secondary | ICD-10-CM | POA: Diagnosis not present

## 2018-06-05 DIAGNOSIS — I10 Essential (primary) hypertension: Secondary | ICD-10-CM

## 2018-06-05 DIAGNOSIS — N179 Acute kidney failure, unspecified: Secondary | ICD-10-CM

## 2018-06-05 DIAGNOSIS — E7800 Pure hypercholesterolemia, unspecified: Secondary | ICD-10-CM

## 2018-06-05 DIAGNOSIS — N189 Chronic kidney disease, unspecified: Secondary | ICD-10-CM

## 2018-06-06 DIAGNOSIS — E860 Dehydration: Secondary | ICD-10-CM | POA: Diagnosis not present

## 2018-06-24 IMAGING — MR MR MRA HEAD W/O CM
1 series · 20 of 48 positions shown · non-contrast
Comparison: MRI head 04/09/2017

CLINICAL DATA: Stroke.  Weakness and numbness right side.

EXAM:
MRA HEAD WITHOUT CONTRAST
TECHNIQUE: Angiographic images of the Circle of Willis were obtained using MRA
technique without intravenous contrast.

[Series 3: ax (id) 2 · axial · 1.0mm · 0.43mm/px · z∈[-35,+51]mm · 20 of 184 slices shown]
[im 1/184]
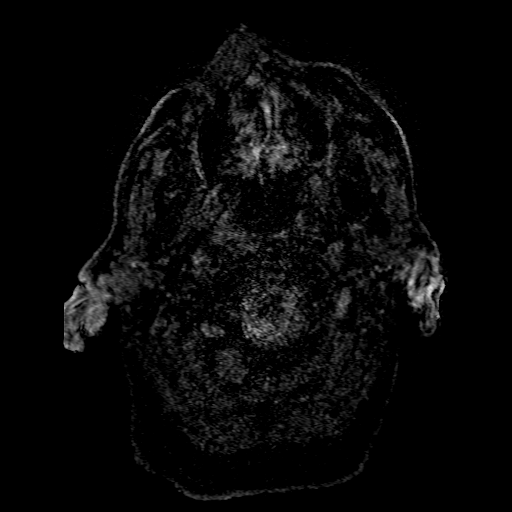
[im 4/184]
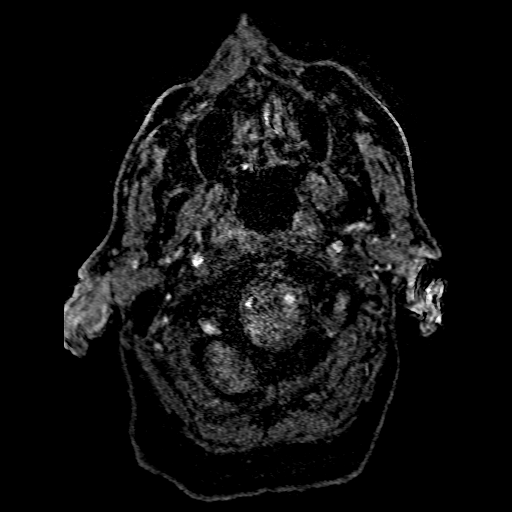
[im 8/184]
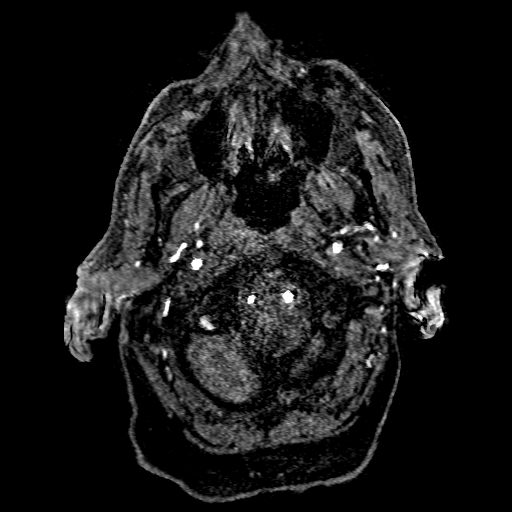
[im 12/184]
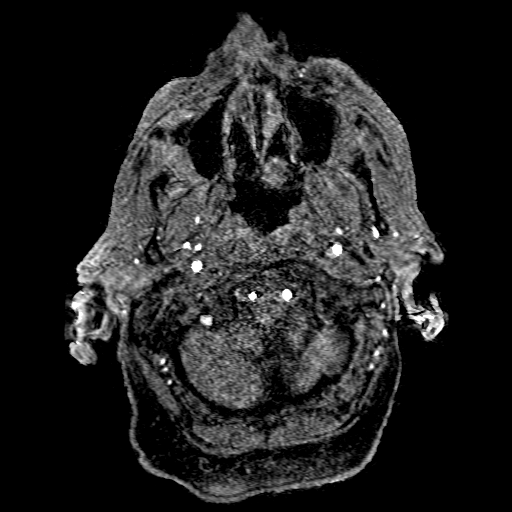
[im 16/184]
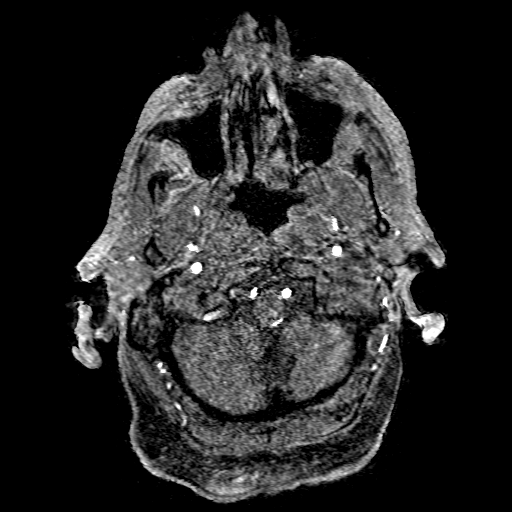
[im 20/184]
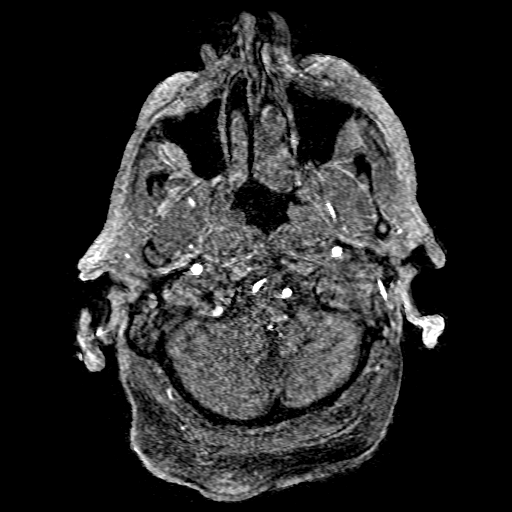
[im 24/184]
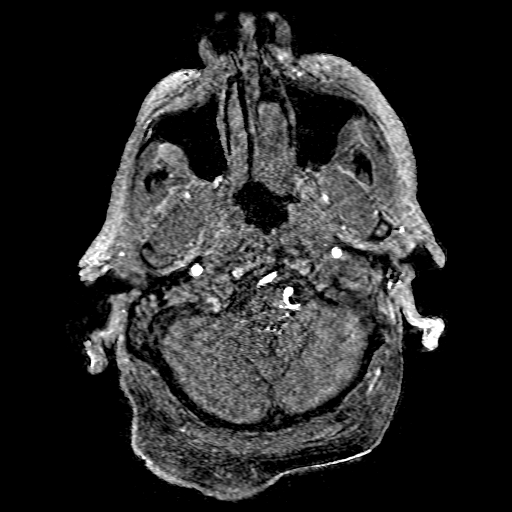
[im 28/184]
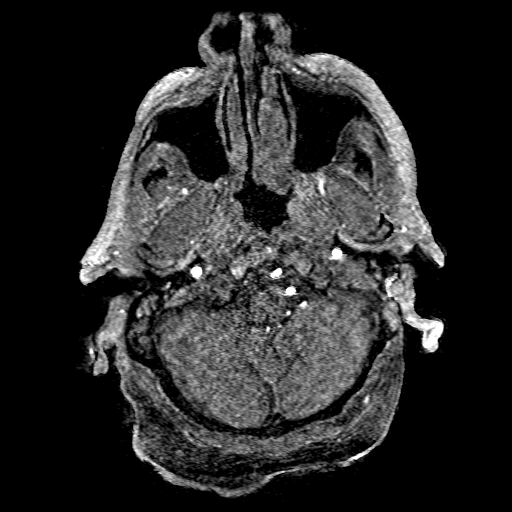
[im 32/184]
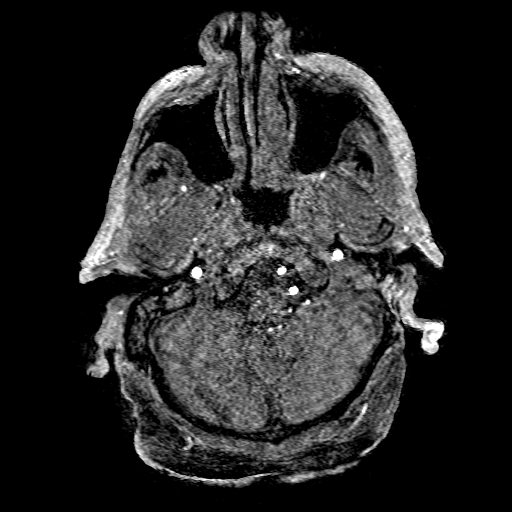
[im 36/184]
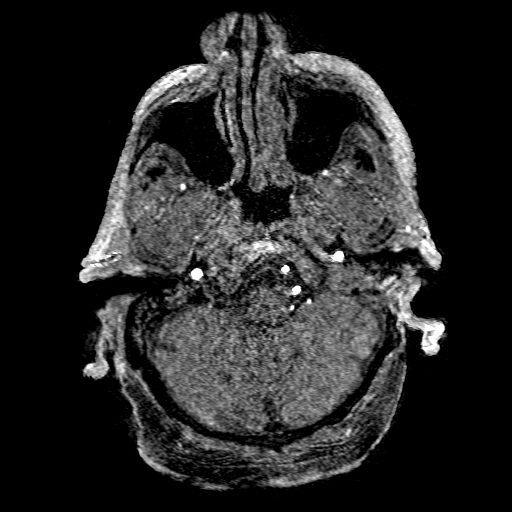
[im 39/184]
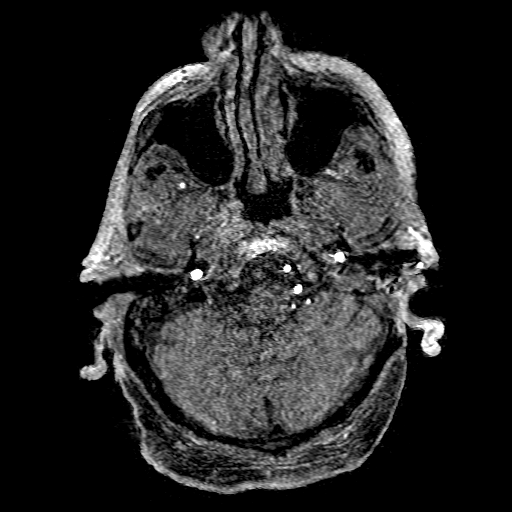
[im 43/184]
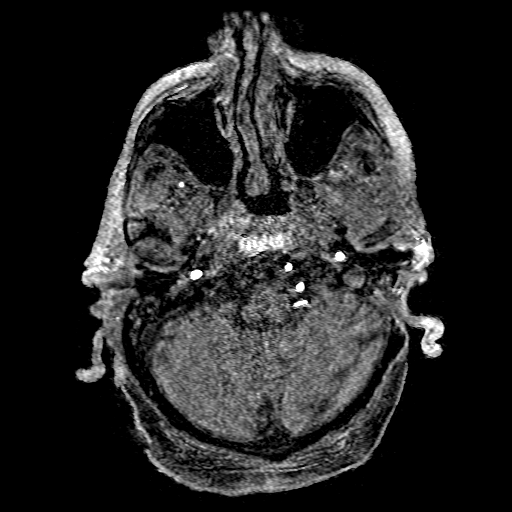
[im 59/184]
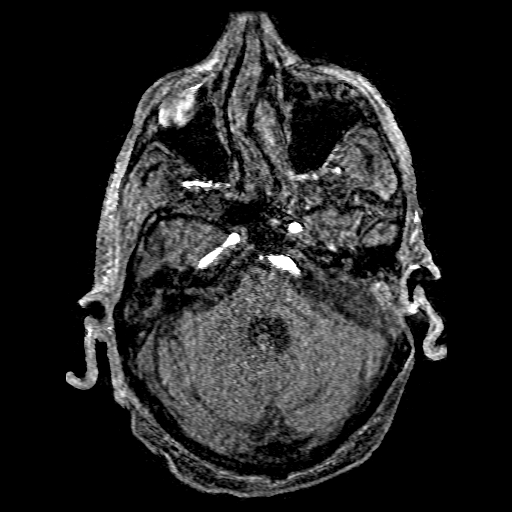
[im 82/184]
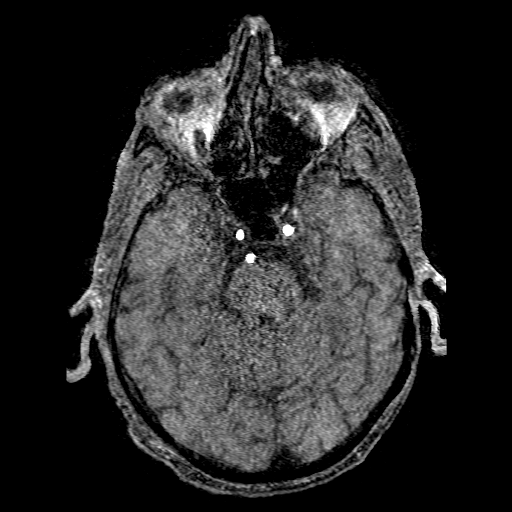
[im 94/184]
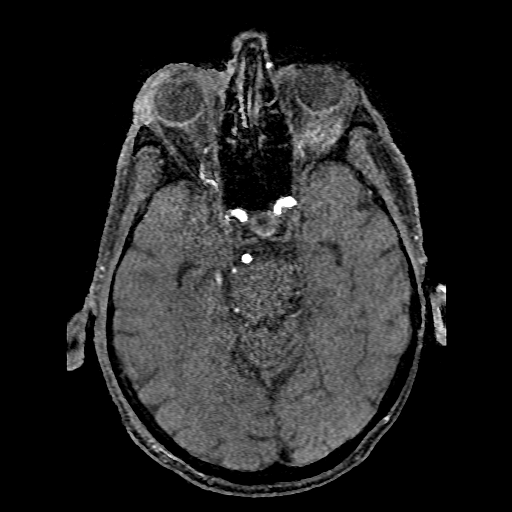
[im 106/184]
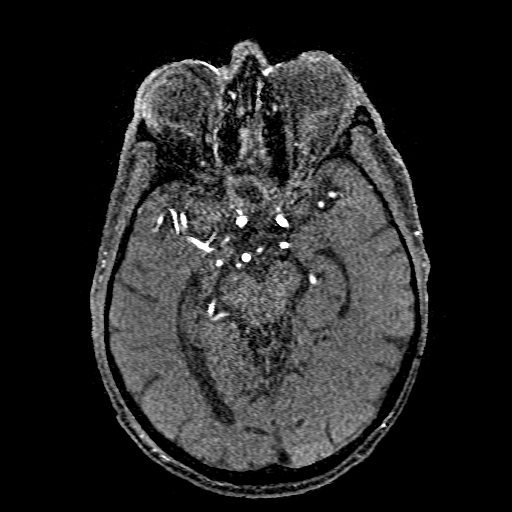
[im 129/184]
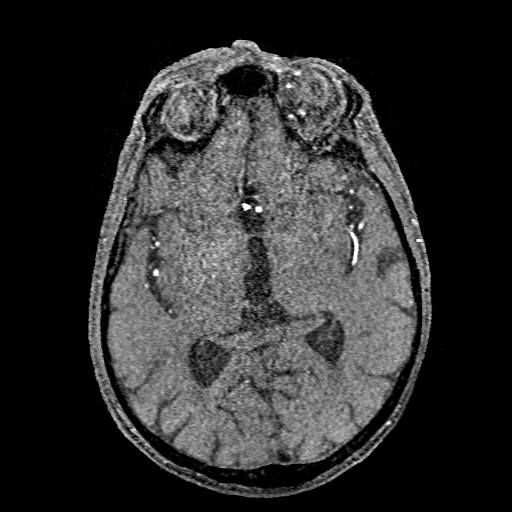
[im 152/184]
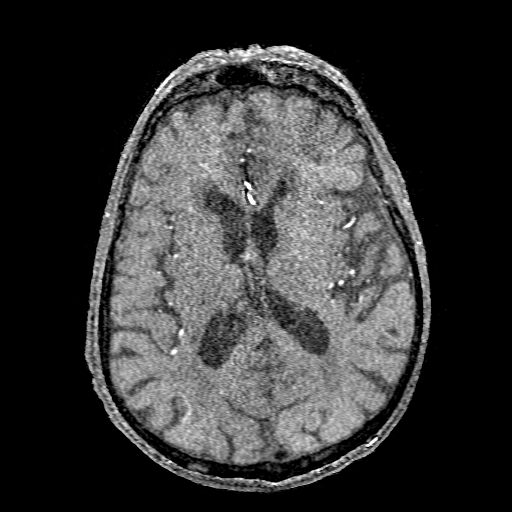
[im 156/184]
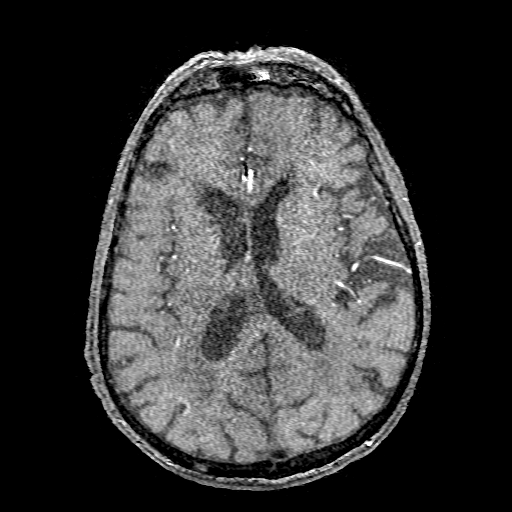
[im 176/184]
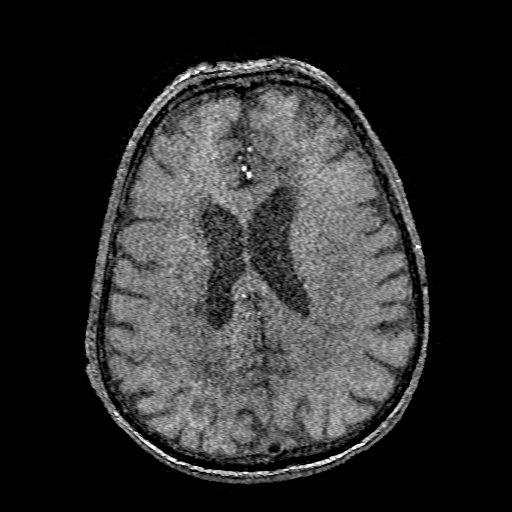

[20 of 48 positions shown; findings below may reference images not displayed]

FINDINGS: Internal carotid artery widely patent. Anterior middle cerebral
arteries normal without stenosis or occlusion

Both vertebral arteries patent to the basilar. Basilar widely
patent. Left PICA patent. Right PICA not visualized. Right AICA
patent. Bilateral superior cerebellar and posterior cerebral
arteries widely patent.

Negative for cerebral aneurysm.
IMPRESSION: Nonvisualization right PICA.  Otherwise negative.

## 2018-06-26 DEATH — deceased
